# Patient Record
Sex: Female | Born: 1950 | Race: Black or African American | Hispanic: No | Marital: Married | Smoking: Former smoker
Health system: Southern US, Community
[De-identification: ages and names within clinical notes are randomized; demographics above are authoritative.]

## PROBLEM LIST (undated history)

## (undated) DIAGNOSIS — I10 Essential (primary) hypertension: Secondary | ICD-10-CM

## (undated) DIAGNOSIS — Z21 Asymptomatic human immunodeficiency virus [HIV] infection status: Secondary | ICD-10-CM

## (undated) DIAGNOSIS — B20 Human immunodeficiency virus [HIV] disease: Secondary | ICD-10-CM

## (undated) DIAGNOSIS — I2510 Atherosclerotic heart disease of native coronary artery without angina pectoris: Secondary | ICD-10-CM

## (undated) DIAGNOSIS — M5430 Sciatica, unspecified side: Secondary | ICD-10-CM

## (undated) DIAGNOSIS — E119 Type 2 diabetes mellitus without complications: Secondary | ICD-10-CM

## (undated) DIAGNOSIS — D759 Disease of blood and blood-forming organs, unspecified: Secondary | ICD-10-CM

## (undated) HISTORY — DX: Sciatica, unspecified side: M54.30

## (undated) HISTORY — PX: CARDIAC CATHETERIZATION: SHX172

## (undated) HISTORY — PX: CORONARY ARTERY BYPASS GRAFT: SHX141

---

## 2012-04-16 ENCOUNTER — Ambulatory Visit (INDEPENDENT_AMBULATORY_CARE_PROVIDER_SITE_OTHER): Payer: Medicaid Other

## 2012-04-16 ENCOUNTER — Ambulatory Visit: Payer: Self-pay

## 2012-04-16 DIAGNOSIS — Z21 Asymptomatic human immunodeficiency virus [HIV] infection status: Secondary | ICD-10-CM

## 2012-04-16 DIAGNOSIS — B20 Human immunodeficiency virus [HIV] disease: Secondary | ICD-10-CM

## 2012-04-16 DIAGNOSIS — E7800 Pure hypercholesterolemia, unspecified: Secondary | ICD-10-CM

## 2012-04-16 DIAGNOSIS — I10 Essential (primary) hypertension: Secondary | ICD-10-CM

## 2012-04-16 DIAGNOSIS — Z23 Encounter for immunization: Secondary | ICD-10-CM

## 2012-04-16 DIAGNOSIS — J45909 Unspecified asthma, uncomplicated: Secondary | ICD-10-CM

## 2012-04-16 DIAGNOSIS — Z951 Presence of aortocoronary bypass graft: Secondary | ICD-10-CM

## 2012-04-16 DIAGNOSIS — E119 Type 2 diabetes mellitus without complications: Secondary | ICD-10-CM

## 2012-04-16 DIAGNOSIS — M109 Gout, unspecified: Secondary | ICD-10-CM

## 2012-04-16 DIAGNOSIS — I2510 Atherosclerotic heart disease of native coronary artery without angina pectoris: Secondary | ICD-10-CM

## 2012-04-16 LAB — CBC WITH DIFFERENTIAL/PLATELET
Basophils Absolute: 0 10*3/uL (ref 0.0–0.1)
Basophils Relative: 0 % (ref 0–1)
Eosinophils Relative: 2 % (ref 0–5)
HCT: 37 % (ref 36.0–46.0)
Lymphocytes Relative: 37 % (ref 12–46)
MCHC: 32.7 g/dL (ref 30.0–36.0)
MCV: 83 fL (ref 78.0–100.0)
Monocytes Absolute: 0.4 10*3/uL (ref 0.1–1.0)
Neutro Abs: 2.8 10*3/uL (ref 1.7–7.7)
Platelets: 300 10*3/uL (ref 150–400)
RDW: 15.8 % — ABNORMAL HIGH (ref 11.5–15.5)
WBC: 5.3 10*3/uL (ref 4.0–10.5)

## 2012-04-17 LAB — T-HELPER CELL (CD4) - (RCID CLINIC ONLY): CD4 T Cell Abs: 670 uL (ref 400–2700)

## 2012-04-17 LAB — LIPID PANEL: LDL Cholesterol: 125 mg/dL — ABNORMAL HIGH (ref 0–99)

## 2012-04-17 LAB — HEPATITIS C ANTIBODY: HCV Ab: NEGATIVE

## 2012-04-17 LAB — COMPLETE METABOLIC PANEL WITH GFR
ALT: 14 U/L (ref 0–35)
AST: 16 U/L (ref 0–37)
Alkaline Phosphatase: 80 U/L (ref 39–117)
BUN: 19 mg/dL (ref 6–23)
Calcium: 9.4 mg/dL (ref 8.4–10.5)
Chloride: 106 mEq/L (ref 96–112)
Creat: 0.89 mg/dL (ref 0.50–1.10)

## 2012-04-17 LAB — HEPATITIS B CORE ANTIBODY, TOTAL: Hep B Core Total Ab: NEGATIVE

## 2012-04-17 LAB — HIV-1 RNA ULTRAQUANT REFLEX TO GENTYP+
HIV 1 RNA Quant: <20 copies/mL (ref <20)
HIV-1 RNA Quant, Log: <1.3 {Log} (ref <1.30)

## 2012-04-17 LAB — HEPATITIS B SURFACE ANTIGEN: Hepatitis B Surface Ag: NEGATIVE

## 2012-04-17 LAB — RPR

## 2012-04-17 LAB — HEPATITIS B SURFACE ANTIBODY,QUALITATIVE: Hep B S Ab: NONREACTIVE

## 2012-04-17 LAB — HEPATITIS A ANTIBODY, TOTAL: Hep A Total Ab: POSITIVE — AB

## 2012-04-18 LAB — TB SKIN TEST
Induration: 0 mm
TB Skin Test: NEGATIVE

## 2012-04-27 DIAGNOSIS — J45909 Unspecified asthma, uncomplicated: Secondary | ICD-10-CM | POA: Insufficient documentation

## 2012-04-27 DIAGNOSIS — Z951 Presence of aortocoronary bypass graft: Secondary | ICD-10-CM | POA: Insufficient documentation

## 2012-04-27 DIAGNOSIS — I2510 Atherosclerotic heart disease of native coronary artery without angina pectoris: Secondary | ICD-10-CM | POA: Insufficient documentation

## 2012-04-27 DIAGNOSIS — I10 Essential (primary) hypertension: Secondary | ICD-10-CM | POA: Insufficient documentation

## 2012-04-27 DIAGNOSIS — B20 Human immunodeficiency virus [HIV] disease: Secondary | ICD-10-CM | POA: Insufficient documentation

## 2012-04-27 DIAGNOSIS — E7800 Pure hypercholesterolemia, unspecified: Secondary | ICD-10-CM | POA: Insufficient documentation

## 2012-04-27 DIAGNOSIS — E119 Type 2 diabetes mellitus without complications: Secondary | ICD-10-CM | POA: Insufficient documentation

## 2012-04-27 DIAGNOSIS — M109 Gout, unspecified: Secondary | ICD-10-CM | POA: Insufficient documentation

## 2012-05-08 LAB — HIV-1 RNA QUANT-NO REFLEX-BLD: HIV 1 RNA Quant: <20

## 2012-05-08 NOTE — Progress Notes (Signed)
Pt and family moved from [place] to [place].  Pt and spouse have  been positive since 2003.   Laurell Josephs, RN

## 2012-05-13 ENCOUNTER — Other Ambulatory Visit (HOSPITAL_COMMUNITY)
Admission: RE | Admit: 2012-05-13 | Discharge: 2012-05-13 | Disposition: A | Discharge status: Home / Self Care | Payer: Medicaid Other | Source: Ambulatory Visit | Attending: Internal Medicine | Admitting: Internal Medicine

## 2012-05-13 ENCOUNTER — Ambulatory Visit (INDEPENDENT_AMBULATORY_CARE_PROVIDER_SITE_OTHER): Payer: Medicaid Other | Admitting: Internal Medicine

## 2012-05-13 ENCOUNTER — Encounter: Payer: Self-pay | Admitting: Internal Medicine

## 2012-05-13 VITALS — BP 143/100 | HR 79 | Temp 97.7°F | Wt 226.0 lb

## 2012-05-13 DIAGNOSIS — Z113 Encounter for screening for infections with a predominantly sexual mode of transmission: Secondary | ICD-10-CM | POA: Insufficient documentation

## 2012-05-13 DIAGNOSIS — Z0000 Encounter for general adult medical examination without abnormal findings: Secondary | ICD-10-CM

## 2012-05-13 NOTE — Addendum Note (Signed)
Addended by: Mariea Clonts D on: 05/13/2012 01:05 PM   Modules accepted: Orders

## 2012-05-13 NOTE — Progress Notes (Signed)
[facility] NOTE  RFV: establishing care, new to the clinic Subjective:    Patient ID: Katie Mccarthy, female    DOB: [birth year], 62 y.o.   MRN: [id number]  HPI 61yo F iwht HIV,CAD s/p CABG 2012, HLD,DM. CD 4 count of 670/ VL < 20,on atripla (in Dec). Dx in 2003, but didn't start cART in 2007 in [place].   Moved to [place] in June 2013. [place] just started with PCP and getting referred to cardiology. (PCP sara Geraldo Pitter, [facility]) doing well overall. No compliants  Current Outpatient Prescriptions on File Prior to Visit  Medication Sig Dispense Refill  . amLODipine (NORVASC) 10 MG tablet Take 10 mg by mouth daily.      . cloNIDine (CATAPRES) 0.1 MG tablet Take 0.1 mg by mouth 2 (two) times daily.      Marland Kitchen efavirenz-emtricitabine-tenofovir (ATRIPLA) 600-200-300 MG per tablet Take 1 tablet by mouth at bedtime.      . furosemide (LASIX) 20 MG tablet Take 20 mg by mouth 2 (two) times daily.      Marland Kitchen gabapentin (NEURONTIN) 100 MG capsule Take 100 mg by mouth 2 (two) times daily.      . isosorbide dinitrate (ISORDIL) 30 MG tablet Take 30 mg by mouth 4 (four) times daily.      Marland Kitchen lisinopril (PRINIVIL,ZESTRIL) 20 MG tablet Take 20 mg by mouth 2 (two) times daily.      . metFORMIN (GLUCOPHAGE) 500 MG tablet Take 500 mg by mouth daily with breakfast.      . metoprolol succinate (TOPROL-XL) 50 MG 24 hr tablet Take 50 mg by mouth 2 (two) times daily. Take with or immediately following a meal.      . pravastatin (PRAVACHOL) 40 MG tablet Take 40 mg by mouth daily.       Active Ambulatory Problems    Diagnosis Date Noted  . HIV (human immunodeficiency virus infection) 04/27/2012  . Gout 04/27/2012  . Hypertension 04/27/2012  . Diabetes 04/27/2012  . Asthma 04/27/2012  . Hypercholesteremia 04/27/2012  . CAD (coronary artery disease) 04/27/2012  . Hx of CABG 04/27/2012   Resolved Ambulatory Problems    Diagnosis Date Noted  . No Resolved Ambulatory Problems   No  Additional Past Medical History   History  Substance Use Topics  . Smoking status: Former Smoker    Types: Cigarettes    Quit date: 11/23/2010  . Smokeless tobacco: Never Used  . Alcohol Use: No  family history includes Coronary artery disease in her father; Diabetes in her brother, father, mother, and sister; and Heart disease in her mother.    Review of Systems Review of Systems  Constitutional: Negative for fever, chills, diaphoresis, activity change, appetite change, fatigue and unexpected weight change.  HENT: Negative for congestion, sore throat, rhinorrhea, sneezing, trouble swallowing and sinus pressure.  Eyes: Negative for photophobia and visual disturbance.  Respiratory: Negative for cough, chest tightness, shortness of breath, wheezing and stridor.  Cardiovascular: Negative for chest pain, palpitations and leg swelling.  Gastrointestinal: Negative for nausea, vomiting, abdominal pain, diarrhea, constipation, blood in stool, abdominal distention and anal bleeding.  Genitourinary: Negative for dysuria, hematuria, flank pain and difficulty urinating.  Musculoskeletal: Negative for myalgias, back pain, joint swelling, arthralgias and gait problem.  Skin: Negative for color change, pallor, rash and wound.  Neurological: Negative for dizziness, tremors, weakness and light-headedness.  Hematological: Negative for adenopathy. Does not bruise/bleed easily.  Psychiatric/Behavioral: Negative for behavioral problems, confusion, sleep disturbance, dysphoric  mood, decreased concentration and agitation.       Objective:   Physical Exam  BP 143/100  Pulse 79  Temp 97.7 F (36.5 C) (Oral)  Wt 226 lb (102.513 kg) Physical Exam  Constitutional: oriented to person, place, and time.  appears well-developed and well-nourished. No distress.  HENT:  Mouth/Throat: Oropharynx is clear and moist. No oropharyngeal exudate.  Cardiovascular: Normal rate, regular rhythm and normal heart sounds.  Exam reveals no gallop and no friction rub.  No murmur heard.  Pulmonary/Chest: Effort normal and breath sounds normal. No respiratory distress. He has no wheezes.  Abdominal: Soft. Bowel sounds are normal.  exhibits no distension. There is no tenderness.  Lymphadenopathy:  no cervical adenopathy.  Neurological:  alert and oriented to person, place, and time.  Skin: Skin is warm and dry. No rash noted. No erythema.       Assessment & Plan:  HIV = continue with current regimen; refill meds. Covered through medicaid  CAD= continue on current regimen, she is to get established with cardiologist  HTN= mildly elevated today. Will defer to pcp and cardiology to adjust meds. 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

## 2012-05-19 ENCOUNTER — Other Ambulatory Visit: Payer: Self-pay | Admitting: Physician Assistant

## 2012-05-19 DIAGNOSIS — Z1231 Encounter for screening mammogram for malignant neoplasm of breast: Secondary | ICD-10-CM

## 2012-06-19 ENCOUNTER — Ambulatory Visit: Payer: Medicaid Other

## 2012-07-06 ENCOUNTER — Ambulatory Visit: Payer: Medicaid Other

## 2012-08-03 ENCOUNTER — Ambulatory Visit: Payer: Medicaid Other

## 2012-08-11 ENCOUNTER — Other Ambulatory Visit: Payer: Medicaid Other

## 2012-08-11 ENCOUNTER — Other Ambulatory Visit: Payer: Self-pay | Admitting: Internal Medicine

## 2012-08-11 DIAGNOSIS — B20 Human immunodeficiency virus [HIV] disease: Secondary | ICD-10-CM

## 2012-08-11 LAB — CBC WITH DIFFERENTIAL/PLATELET
Basophils Absolute: 0 10*3/uL (ref 0.0–0.1)
Lymphocytes Relative: 36 % (ref 12–46)
Lymphs Abs: 1.6 10*3/uL (ref 0.7–4.0)
MCV: 84.6 fL (ref 78.0–100.0)
Neutro Abs: 2.5 10*3/uL (ref 1.7–7.7)
Platelets: 285 10*3/uL (ref 150–400)
RBC: 4.49 MIL/uL (ref 3.87–5.11)
RDW: 15.6 % — ABNORMAL HIGH (ref 11.5–15.5)
WBC: 4.5 10*3/uL (ref 4.0–10.5)

## 2012-08-11 LAB — COMPREHENSIVE METABOLIC PANEL
AST: 15 U/L (ref 0–37)
Albumin: 3.9 g/dL (ref 3.5–5.2)
Alkaline Phosphatase: 78 U/L (ref 39–117)
BUN: 20 mg/dL (ref 6–23)
Calcium: 9.3 mg/dL (ref 8.4–10.5)
Creat: 0.95 mg/dL (ref 0.50–1.10)
Glucose, Bld: 98 mg/dL (ref 70–99)

## 2012-08-12 LAB — T-HELPER CELL (CD4) - (RCID CLINIC ONLY): CD4 % Helper T Cell: 36 % (ref 33–55)

## 2012-08-12 LAB — HIV-1 RNA QUANT-NO REFLEX-BLD: HIV 1 RNA Quant: <20 copies/mL (ref <20)

## 2012-08-20 ENCOUNTER — Emergency Department (HOSPITAL_COMMUNITY)
Admission: EM | Admit: 2012-08-20 | Discharge: 2012-08-21 | Disposition: A | Discharge status: Home / Self Care | Payer: Medicaid Other | Attending: Emergency Medicine | Admitting: Emergency Medicine

## 2012-08-20 ENCOUNTER — Emergency Department (HOSPITAL_COMMUNITY): Payer: Medicaid Other

## 2012-08-20 ENCOUNTER — Encounter (HOSPITAL_COMMUNITY): Payer: Self-pay | Admitting: *Deleted

## 2012-08-20 DIAGNOSIS — E119 Type 2 diabetes mellitus without complications: Secondary | ICD-10-CM | POA: Insufficient documentation

## 2012-08-20 DIAGNOSIS — E669 Obesity, unspecified: Secondary | ICD-10-CM | POA: Insufficient documentation

## 2012-08-20 DIAGNOSIS — I10 Essential (primary) hypertension: Secondary | ICD-10-CM | POA: Insufficient documentation

## 2012-08-20 DIAGNOSIS — R079 Chest pain, unspecified: Secondary | ICD-10-CM

## 2012-08-20 DIAGNOSIS — I2510 Atherosclerotic heart disease of native coronary artery without angina pectoris: Secondary | ICD-10-CM | POA: Insufficient documentation

## 2012-08-20 DIAGNOSIS — R0602 Shortness of breath: Secondary | ICD-10-CM | POA: Insufficient documentation

## 2012-08-20 DIAGNOSIS — Z79899 Other long term (current) drug therapy: Secondary | ICD-10-CM | POA: Insufficient documentation

## 2012-08-20 DIAGNOSIS — Z21 Asymptomatic human immunodeficiency virus [HIV] infection status: Secondary | ICD-10-CM | POA: Insufficient documentation

## 2012-08-20 DIAGNOSIS — Z7982 Long term (current) use of aspirin: Secondary | ICD-10-CM | POA: Insufficient documentation

## 2012-08-20 DIAGNOSIS — Z87891 Personal history of nicotine dependence: Secondary | ICD-10-CM | POA: Insufficient documentation

## 2012-08-20 DIAGNOSIS — Z8679 Personal history of other diseases of the circulatory system: Secondary | ICD-10-CM | POA: Insufficient documentation

## 2012-08-20 HISTORY — DX: Type 2 diabetes mellitus without complications: E11.9

## 2012-08-20 HISTORY — DX: Human immunodeficiency virus (HIV) disease: B20

## 2012-08-20 HISTORY — DX: Asymptomatic human immunodeficiency virus (hiv) infection status: Z21

## 2012-08-20 HISTORY — DX: Essential (primary) hypertension: I10

## 2012-08-20 HISTORY — DX: Atherosclerotic heart disease of native coronary artery without angina pectoris: I25.10

## 2012-08-20 LAB — CBC WITH DIFFERENTIAL/PLATELET
Basophils Absolute: 0 10*3/uL (ref 0.0–0.1)
Eosinophils Relative: 1 % (ref 0–5)
HCT: 35.3 % — ABNORMAL LOW (ref 36.0–46.0)
Lymphocytes Relative: 36 % (ref 12–46)
Lymphs Abs: 2.6 10*3/uL (ref 0.7–4.0)
MCV: 81 fL (ref 78.0–100.0)
Neutro Abs: 3.9 10*3/uL (ref 1.7–7.7)
Platelets: 271 10*3/uL (ref 150–400)
RBC: 4.36 MIL/uL (ref 3.87–5.11)
RDW: 14 % (ref 11.5–15.5)
WBC: 7.2 10*3/uL (ref 4.0–10.5)

## 2012-08-20 LAB — BASIC METABOLIC PANEL
CO2: 25 mEq/L (ref 19–32)
Chloride: 105 mEq/L (ref 96–112)
Glucose, Bld: 121 mg/dL — ABNORMAL HIGH (ref 70–99)
Sodium: 140 mEq/L (ref 135–145)

## 2012-08-20 LAB — POCT I-STAT TROPONIN I: Troponin i, poc: 0 ng/mL (ref 0.00–0.08)

## 2012-08-20 MED ORDER — IOHEXOL 350 MG/ML SOLN
100.0000 mL | Freq: Once | INTRAVENOUS | Status: AC | PRN
  Administered 2012-08-20: 100 mL via INTRAVENOUS

## 2012-08-20 NOTE — ED Notes (Signed)
Pt reports chest pain for three days that seem to be worse with deep breathing.  One SL Nitro given by EMS, that resolved pt's chest pain.

## 2012-08-20 NOTE — ED Notes (Signed)
ED PA in room with pt 

## 2012-08-20 NOTE — ED Provider Notes (Signed)
History     CSN: [id number]  Arrival date & time 08/20/12  4098   First MD Initiated Contact with Patient 08/20/12 2007      Chief Complaint  Patient presents with  . Chest Pain    (Consider location/radiation/quality/duration/timing/severity/associated sxs/prior treatment) HPI Katie Mccarthy is a 62 y.o. female who presents to ED with complaint of chest pain. States has had 3 stents placed in the past in [facility] for CAD, currently followed by a cardiology in [place]. States this pain started 3 days ago. Sharp. Worse with breathing. States also having some exertional chest tightness and shortness of breath. Pain is constant for last 3 days, sharp, worse with deep breathing. States no recent heart problems. No fever, chills, cough. No abdominal pain. Given a nitro per EMS with improvement in chest pain. States nothing making it better or worse at present. No other complaints. Pt is HIV positive.   Past Medical History  Diagnosis Date  . Coronary artery disease   . Hypertension   . Diabetes mellitus without complication   . HIV infection     Past Surgical History  Procedure Laterality Date  . Cardiac catheterization      Family History  Problem Relation Age of Onset  . Diabetes Mother   . Heart disease Mother   . Diabetes Father   . Coronary artery disease Father   . Diabetes Sister   . Diabetes Brother     History  Substance Use Topics  . Smoking status: Former Smoker    Types: Cigarettes    Quit date: 11/23/2010  . Smokeless tobacco: Never Used  . Alcohol Use: No    OB History   Grav Para Term Preterm Abortions TAB SAB Ect Mult Living                  Review of Systems  Constitutional: Negative for fever and chills.  HENT: Negative for neck pain and neck stiffness.   Respiratory: Positive for chest tightness and shortness of breath.   Cardiovascular: Positive for chest pain. Negative for palpitations and leg swelling.  Gastrointestinal: Negative.    Genitourinary: Negative for dysuria and flank pain.  Musculoskeletal: Negative.   Neurological: Negative for dizziness, weakness and headaches.    Allergies  Review of patient's allergies indicates no known allergies.  Home Medications   Current Outpatient Rx  Name  Route  Sig  Dispense  Refill  . amLODipine (NORVASC) 10 MG tablet   Oral   Take 10 mg by mouth daily.         Marland Kitchen aspirin 81 MG chewable tablet   Oral   Chew 81 mg by mouth daily. Took 2 tablets in morning, 2 more around lunch time, then EMS told her to take 4 more tablets-only for today 08/20/12 (CP).         . cloNIDine (CATAPRES) 0.1 MG tablet   Oral   Take 0.1 mg by mouth 2 (two) times daily.         Marland Kitchen efavirenz-emtricitabine-tenofovir (ATRIPLA) 600-200-300 MG per tablet   Oral   Take 1 tablet by mouth at bedtime.         . furosemide (LASIX) 20 MG tablet   Oral   Take 20 mg by mouth daily.          Marland Kitchen gabapentin (NEURONTIN) 100 MG capsule   Oral   Take 300 mg by mouth every evening.          . isosorbide  mononitrate (IMDUR) 60 MG 24 hr tablet   Oral   Take 60 mg by mouth daily.         Marland Kitchen lisinopril (PRINIVIL,ZESTRIL) 20 MG tablet   Oral   Take 20 mg by mouth daily.          . metFORMIN (GLUCOPHAGE) 500 MG tablet   Oral   Take 500 mg by mouth daily with breakfast.         . metoprolol succinate (TOPROL-XL) 50 MG 24 hr tablet   Oral   Take 50 mg by mouth daily. Take with or immediately following a meal.           BP 123/87  Pulse 92  Temp(Src) 98.5 F (36.9 C) (Oral)  Resp 20  Ht 5\' 9"  (1.753 m)  Wt 214 lb (97.07 kg)  BMI 31.59 kg/m2  SpO2 98%  Physical Exam  Nursing note and vitals reviewed. Constitutional: She is oriented to person, place, and time. She appears well-developed and well-nourished. No distress.  HENT:  Head: Normocephalic and atraumatic.  Eyes: Conjunctivae are normal.  Neck: Neck supple.  Cardiovascular: Normal rate, regular rhythm and normal  heart sounds.   Pulmonary/Chest: Effort normal and breath sounds normal. No respiratory distress. She has no wheezes. She has no rales.  Abdominal: Soft. Bowel sounds are normal. She exhibits no distension. There is no tenderness. There is no rebound.  Musculoskeletal: She exhibits no edema.  Neurological: She is alert and oriented to person, place, and time.  Skin: Skin is warm and dry.  Psychiatric: She has a normal mood and affect.    ED Course  Procedures (including critical care time)  Results for orders placed during the hospital encounter of 08/20/12  CBC WITH DIFFERENTIAL      Result Value Range   WBC 7.2  4.0 - 10.5 K/uL   RBC 4.36  3.87 - 5.11 MIL/uL   Hemoglobin 12.0  12.0 - 15.0 g/dL   HCT 14.7 (*) 82.9 - 56.2 %   MCV 81.0  78.0 - 100.0 fL   MCH 27.5  26.0 - 34.0 pg   MCHC 34.0  30.0 - 36.0 g/dL   RDW 13.0  86.5 - 78.4 %   Platelets 271  150 - 400 K/uL   Neutrophils Relative 54  43 - 77 %   Neutro Abs 3.9  1.7 - 7.7 K/uL   Lymphocytes Relative 36  12 - 46 %   Lymphs Abs 2.6  0.7 - 4.0 K/uL   Monocytes Relative 9  3 - 12 %   Monocytes Absolute 0.7  0.1 - 1.0 K/uL   Eosinophils Relative 1  0 - 5 %   Eosinophils Absolute 0.1  0.0 - 0.7 K/uL   Basophils Relative 0  0 - 1 %   Basophils Absolute 0.0  0.0 - 0.1 K/uL  BASIC METABOLIC PANEL      Result Value Range   Sodium 140  135 - 145 mEq/L   Potassium 3.9  3.5 - 5.1 mEq/L   Chloride 105  96 - 112 mEq/L   CO2 25  19 - 32 mEq/L   Glucose, Bld 121 (*) 70 - 99 mg/dL   BUN 12  6 - 23 mg/dL   Creatinine, Ser 6.96  0.50 - 1.10 mg/dL   Calcium 9.5  8.4 - 29.5 mg/dL   GFR calc non Af Amer 65 (*) >90 mL/min   GFR calc Af Amer 76 (*) >90 mL/min  D-DIMER, QUANTITATIVE  Result Value Range   D-Dimer, Quant 0.49 (*) 0.00 - 0.48 ug/mL-FEU  POCT I-STAT TROPONIN I      Result Value Range   Troponin i, poc 0.00  0.00 - 0.08 ng/mL   Comment 3           POCT I-STAT TROPONIN I      Result Value Range   Troponin i, poc 0.01   0.00 - 0.08 ng/mL   Comment 3            Ct Angio Chest W/cm &/or Wo Cm  08/20/2012  *RADIOLOGY REPORT*  Clinical Data: Chest pain.  Painful respiration.  CT ANGIOGRAPHY CHEST  Technique:  Multidetector CT imaging of the chest using the standard protocol during bolus administration of intravenous contrast. Multiplanar reconstructed images including MIPs were obtained and reviewed to evaluate the vascular anatomy.  Contrast: OMNIPAQUE IOHEXOL 350 MG/ML SOLN  Comparison: Chest x-ray dated 08/20/2012  Findings: There are no pulmonary emboli, infiltrates, or effusions. Slight cardiomegaly.  Slight atelectasis at the lung bases.  No acute osseous abnormality.  Prior CABG.  Coronary artery stent in place.  IMPRESSION:  1.  No pulmonary emboli. 2.  Slight bibasilar atelectasis. 3.  Slight cardiomegaly.   Original Report Authenticated By: Francene Boyers, M.D.    Dg Chest Portable 1 View  08/20/2012  *RADIOLOGY REPORT*  Clinical Data: Chest pain  PORTABLE CHEST - 1 VIEW  Comparison: None.  Findings: There is bibasilar linear atelectasis present.  Early pneumonia particularly at the left lung base cannot be excluded and follow-up chest x-ray is recommended if warranted clinically.  The heart is mildly enlarged.  Median sternotomy sutures are noted.  No bony abnormality is seen.  IMPRESSION: Bibasilar opacities most consistent with linear atelectasis. Pneumonia particularly at the left lung base cannot be excluded. Consider follow-up.   Original Report Authenticated By: Dwyane Dee, M.D.       1. Chest pain       MDM  Pt with atypical chest pain. She does have hx of CAD with stenting. Followed by Dr. Leeann Must cardiology in [place]. Her pain today is atypical for CAD. Her ECG unremarkable. Her Troponin x 2 was obtained and negative, given pain for last 3 days constat, I think appropriate for rule out. Her d dimer slightly elevated, CT angio was obtained and negative. No signs of pneumonia on CT as  well as was suspected on CXR. Discussed with Dr. Ignacia Palma, who has seen pt. Will d/c home with close follow up with Dr. Leeann Must. Pt agrees with the plan.   Filed Vitals:   08/20/12 1943 08/20/12 1946 08/20/12 1953 08/20/12 2313  BP:   123/87 138/82  Pulse: 92     Temp:   98.5 F (36.9 C)   TempSrc:   Oral   Resp:   20 11  Height:  5\' 9"  (1.753 m)    Weight:  214 lb (97.07 kg)    SpO2: 98%  98% 99%           Myriam Jacobson Chestine Belknap, PA-C 08/21/12 0016

## 2012-08-20 NOTE — ED Provider Notes (Signed)
8:08 PM  Date: 08/20/2012  Rate:87  Rhythm: normal sinus rhythm  QRS Axis: left  Intervals: normal QRS:  Left atrial abnormality.  ST/T Wave abnormalities: Inverted T waves in I, AVL  Conduction Disutrbances:none  Narrative Interpretation: Abnormal EKG  Old EKG Reviewed: none available    Carleene Cooper III, MD 08/20/12 2013

## 2012-08-21 NOTE — Progress Notes (Signed)
P is a 62 yo woman who had cardiac stents 2 years ago in [place], who has had chest pain and cough for 3 days.  Exam shows her to be an obese woman in no distress.  Lungs clear, heart sound normal.  EKG nonacute, cardiac markers x 2 negative, CT angio of chest negative.  Reassured and released, with advice to have office followup with Dr. Leeann Must, her cardiologist in [place], [place].

## 2012-08-21 NOTE — ED Provider Notes (Signed)
Medical screening examination/treatment/procedure(s) were conducted as a shared visit with non-physician practitioner(s) and myself.  I personally evaluated the patient during the encounter P is a 62 yo woman who had cardiac stents 2 years ago in [place], who has had chest pain and cough for 3 days. Exam shows her to be an obese woman in no distress. Lungs clear, heart sound normal. EKG nonacute, cardiac markers x 2 negative, CT angio of chest negative. Reassured and released, with advice to have office followup with Dr. Leeann Must, her cardiologist in [place], [place].        Carleene Cooper III, MD 08/21/12 [phone]

## 2012-08-25 ENCOUNTER — Ambulatory Visit: Payer: Medicaid Other | Admitting: Internal Medicine

## 2012-08-31 ENCOUNTER — Ambulatory Visit
Admission: RE | Admit: 2012-08-31 | Discharge: 2012-08-31 | Disposition: A | Discharge status: Home / Self Care | Payer: Medicaid Other | Source: Ambulatory Visit | Attending: Physician Assistant | Admitting: Physician Assistant

## 2012-08-31 DIAGNOSIS — Z1231 Encounter for screening mammogram for malignant neoplasm of breast: Secondary | ICD-10-CM

## 2012-09-14 ENCOUNTER — Other Ambulatory Visit: Payer: Self-pay | Admitting: Physician Assistant

## 2012-09-14 DIAGNOSIS — R928 Other abnormal and inconclusive findings on diagnostic imaging of breast: Secondary | ICD-10-CM

## 2012-09-23 ENCOUNTER — Ambulatory Visit: Payer: Medicaid Other | Admitting: Internal Medicine

## 2012-09-30 ENCOUNTER — Encounter: Payer: Self-pay | Admitting: Internal Medicine

## 2012-09-30 ENCOUNTER — Ambulatory Visit (INDEPENDENT_AMBULATORY_CARE_PROVIDER_SITE_OTHER): Payer: Medicaid Other | Admitting: Internal Medicine

## 2012-09-30 VITALS — BP 164/103 | HR 78 | Temp 97.6°F | Wt 221.0 lb

## 2012-09-30 DIAGNOSIS — M109 Gout, unspecified: Secondary | ICD-10-CM

## 2012-09-30 MED ORDER — PREDNISONE 20 MG PO TABS: 40.0000 mg | ORAL_TABLET | Freq: Every day | ORAL | Status: DC

## 2012-09-30 MED ORDER — OXYCODONE-ACETAMINOPHEN 5-325 MG PO TABS: 1.0000 | ORAL_TABLET | Freq: Four times a day (QID) | ORAL | Status: DC | PRN

## 2012-09-30 NOTE — Progress Notes (Signed)
[facility] NOTE  RFV: routine/also gout  Subjective:    Patient ID: Katie Mccarthy, female    DOB: Aug 26, 1950, 62 y.o.   MRN: [id number]  HPI Katie Mccarthy, 63 yo F with HIV, CD 4 count of 590/VL<20, on atripla doing welll. Also has CAD s/p CABG 2012, HLD,DM. Noticed having gout attack in the past 2 wks the right foot great toe. Not taking meds for gout.Not on allopurinol nor colchicine. She has been taking 400mg  ibuprofen BID.Hasn't been checking blood sugars bid. Reports not missing a dose of atripla despite new health problem.  No fever, chills, nightsweats. Only pain to right toe   Current Outpatient Prescriptions on File Prior to Visit  Medication Sig Dispense Refill  . amLODipine (NORVASC) 10 MG tablet Take 10 mg by mouth daily.      Marland Kitchen aspirin 81 MG chewable tablet Chew 81 mg by mouth daily. Took 2 tablets in morning, 2 more around lunch time, then EMS told her to take 4 more tablets-only for today 08/20/12 (CP).      . cloNIDine (CATAPRES) 0.1 MG tablet Take 0.1 mg by mouth 2 (two) times daily.      Marland Kitchen efavirenz-emtricitabine-tenofovir (ATRIPLA) 600-200-300 MG per tablet Take 1 tablet by mouth at bedtime.      . furosemide (LASIX) 20 MG tablet Take 20 mg by mouth daily.       Marland Kitchen gabapentin (NEURONTIN) 100 MG capsule Take 300 mg by mouth every evening.       . isosorbide mononitrate (IMDUR) 60 MG 24 hr tablet Take 60 mg by mouth daily.      Marland Kitchen lisinopril (PRINIVIL,ZESTRIL) 20 MG tablet Take 20 mg by mouth daily.       . metFORMIN (GLUCOPHAGE) 500 MG tablet Take 500 mg by mouth daily with breakfast.      . metoprolol succinate (TOPROL-XL) 50 MG 24 hr tablet Take 50 mg by mouth daily. Take with or immediately following a meal.       No current facility-administered medications on file prior to visit.   Social and family hx unchanged    Review of Systems  Constitutional: Negative for fever, chills, diaphoresis, activity change, appetite change, fatigue and unexpected weight change.   HENT: Negative for congestion, sore throat, rhinorrhea, sneezing, trouble swallowing and sinus pressure.  Eyes: Negative for photophobia and visual disturbance.  Respiratory: Negative for cough, chest tightness, shortness of breath, wheezing and stridor.  Cardiovascular: Negative for chest pain, palpitations and leg swelling.  Gastrointestinal: Negative for nausea, vomiting, abdominal pain, diarrhea, constipation, blood in stool, abdominal distention and anal bleeding.  Genitourinary: Negative for dysuria, hematuria, flank pain and difficulty urinating.  Musculoskeletal: right toe pain  Skin: Negative for color change, pallor, rash and wound.  Neurological: Negative for dizziness, tremors, weakness and light-headedness.  Hematological: Negative for adenopathy. Does not bruise/bleed easily.  Psychiatric/Behavioral: Negative for behavioral problems, confusion, sleep disturbance, dysphoric mood, decreased concentration and agitation.       Objective:   Physical Exam BP 164/103  Pulse 78  Temp(Src) 97.6 F (36.4 C) (Oral)  Wt 221 lb (100.245 kg)  BMI 32.62 kg/m2 Physical Exam  Constitutional:  oriented to person, place, and time. appears well-developed and well-nourished. No distress.  HENT:  Mouth/Throat: Oropharynx is clear and moist. No oropharyngeal exudate.  Cardiovascular: Normal rate, regular rhythm and normal heart sounds. Exam reveals no gallop and no friction rub.  No murmur heard.  Pulmonary/Chest: Effort normal and breath sounds normal. No respiratory distress.  He has no wheezes.  Abdominal: Soft. Bowel sounds are normal. He exhibits no distension. There is no tenderness.  Lymphadenopathy:  no cervical adenopathy.  Neurological:  alert and oriented to person, place, and time. Tender 1st mp joint on right foot  Skin: Skin is warm and dry. No rash noted. No erythema.  Psychiatric:  a normal mood and affect.  behavior is normal.       Assessment & Plan:  HIV = not missed  any dose. Having great suppression. Continue with atripla.  Gouty flare = will give steroid taper, prednisone 40mg  x 5 days, then 20mg  for 5 days. Last attack was 18 months. Percocet for pain meds #15.  Women's health = needs repeat mammo which is scheduled for this month  rtc in 3 months

## 2012-10-06 ENCOUNTER — Ambulatory Visit
Admission: RE | Admit: 2012-10-06 | Discharge: 2012-10-06 | Disposition: A | Discharge status: Home / Self Care | Payer: Medicaid Other | Source: Ambulatory Visit | Attending: Physician Assistant | Admitting: Physician Assistant

## 2012-10-06 DIAGNOSIS — R928 Other abnormal and inconclusive findings on diagnostic imaging of breast: Secondary | ICD-10-CM

## 2012-11-16 ENCOUNTER — Encounter: Payer: Self-pay | Admitting: *Deleted

## 2012-12-22 ENCOUNTER — Other Ambulatory Visit: Payer: Medicaid Other

## 2012-12-28 ENCOUNTER — Other Ambulatory Visit (HOSPITAL_COMMUNITY)
Admission: RE | Admit: 2012-12-28 | Discharge: 2012-12-28 | Disposition: A | Discharge status: Home / Self Care | Payer: Medicaid Other | Source: Ambulatory Visit | Attending: Internal Medicine | Admitting: Internal Medicine

## 2012-12-28 ENCOUNTER — Ambulatory Visit (INDEPENDENT_AMBULATORY_CARE_PROVIDER_SITE_OTHER): Payer: Medicaid Other | Admitting: *Deleted

## 2012-12-28 ENCOUNTER — Other Ambulatory Visit: Payer: Medicaid Other

## 2012-12-28 DIAGNOSIS — IMO0002 Reserved for concepts with insufficient information to code with codable children: Secondary | ICD-10-CM

## 2012-12-28 DIAGNOSIS — Z23 Encounter for immunization: Secondary | ICD-10-CM

## 2012-12-28 DIAGNOSIS — B20 Human immunodeficiency virus [HIV] disease: Secondary | ICD-10-CM

## 2012-12-28 DIAGNOSIS — R6889 Other general symptoms and signs: Secondary | ICD-10-CM

## 2012-12-28 DIAGNOSIS — Z01419 Encounter for gynecological examination (general) (routine) without abnormal findings: Secondary | ICD-10-CM | POA: Insufficient documentation

## 2012-12-28 DIAGNOSIS — Z124 Encounter for screening for malignant neoplasm of cervix: Secondary | ICD-10-CM

## 2012-12-28 LAB — CBC WITH DIFFERENTIAL/PLATELET
HCT: 38.7 % (ref 36.0–46.0)
Lymphocytes Relative: 47 % — ABNORMAL HIGH (ref 12–46)
Lymphs Abs: 2.3 10*3/uL (ref 0.7–4.0)
MCH: 27.4 pg (ref 26.0–34.0)
MCV: 83.6 fL (ref 78.0–100.0)
Monocytes Relative: 7 % (ref 3–12)
Neutrophils Relative %: 43 % (ref 43–77)
Platelets: 325 10*3/uL (ref 150–400)

## 2012-12-28 LAB — COMPREHENSIVE METABOLIC PANEL
ALT: 13 U/L (ref 0–35)
Albumin: 4.2 g/dL (ref 3.5–5.2)
CO2: 26 mEq/L (ref 19–32)
Calcium: 9.6 mg/dL (ref 8.4–10.5)
Chloride: 106 mEq/L (ref 96–112)
Glucose, Bld: 98 mg/dL (ref 70–99)
Potassium: 4.7 mEq/L (ref 3.5–5.3)
Sodium: 140 mEq/L (ref 135–145)
Total Protein: 7.2 g/dL (ref 6.0–8.3)

## 2012-12-28 NOTE — Patient Instructions (Signed)
Your results will be available on MyChart next week.  I will send you a reminder to look at them.  Thank you for coming to the Center for your care.  Angelique Blonder, RN

## 2012-12-28 NOTE — Progress Notes (Addendum)
  Subjective:     Katie Mccarthy is a 62 y.o. woman who comes in today for a  pap smear only.  Her most recent Pap smear was on 2004 . Previous abnormal Pap smears: no. Contraception: condoms.  Objective:    There were no vitals taken for this visit. Pelvic Exam:  Pap smear obtained.   Assessment:   Screening pap smear.   Plan:    Follow up in one year, or as indicated by Pap results.  Pt given educational materials re: HIV and diet, nutrition, exercise, BSE, health promotion, self-esteem, partner protection and PAP smears. Given condoms.   LSIL PAP smear results.  Referral to [facility] for f/u.

## 2012-12-29 LAB — HIV-1 RNA QUANT-NO REFLEX-BLD
HIV 1 RNA Quant: <20 copies/mL (ref <20)
HIV-1 RNA Quant, Log: <1.3 {Log} (ref <1.30)

## 2012-12-30 ENCOUNTER — Encounter: Payer: Self-pay | Admitting: *Deleted

## 2012-12-30 DIAGNOSIS — IMO0002 Reserved for concepts with insufficient information to code with codable children: Secondary | ICD-10-CM | POA: Insufficient documentation

## 2012-12-30 NOTE — Addendum Note (Signed)
Addended by: Jennet Maduro D on: 12/30/2012 12:54 PM   Modules accepted: Orders

## 2013-01-05 ENCOUNTER — Ambulatory Visit: Payer: Medicaid Other | Admitting: Internal Medicine

## 2013-01-06 ENCOUNTER — Encounter: Payer: Self-pay | Admitting: *Deleted

## 2013-01-07 ENCOUNTER — Ambulatory Visit: Payer: Medicaid Other | Admitting: Internal Medicine

## 2013-01-14 ENCOUNTER — Ambulatory Visit: Payer: Medicaid Other | Admitting: Internal Medicine

## 2013-01-17 ENCOUNTER — Encounter (HOSPITAL_COMMUNITY): Payer: Self-pay | Admitting: Emergency Medicine

## 2013-01-17 ENCOUNTER — Emergency Department (HOSPITAL_COMMUNITY)
Admission: EM | Admit: 2013-01-17 | Discharge: 2013-01-18 | Disposition: A | Discharge status: Home / Self Care | Payer: Medicaid Other | Attending: Emergency Medicine | Admitting: Emergency Medicine

## 2013-01-17 DIAGNOSIS — E669 Obesity, unspecified: Secondary | ICD-10-CM

## 2013-01-17 DIAGNOSIS — Z9861 Coronary angioplasty status: Secondary | ICD-10-CM | POA: Insufficient documentation

## 2013-01-17 DIAGNOSIS — K458 Other specified abdominal hernia without obstruction or gangrene: Secondary | ICD-10-CM

## 2013-01-17 DIAGNOSIS — E119 Type 2 diabetes mellitus without complications: Secondary | ICD-10-CM

## 2013-01-17 DIAGNOSIS — Z951 Presence of aortocoronary bypass graft: Secondary | ICD-10-CM | POA: Insufficient documentation

## 2013-01-17 DIAGNOSIS — G8929 Other chronic pain: Secondary | ICD-10-CM | POA: Insufficient documentation

## 2013-01-17 DIAGNOSIS — I2510 Atherosclerotic heart disease of native coronary artery without angina pectoris: Secondary | ICD-10-CM | POA: Insufficient documentation

## 2013-01-17 DIAGNOSIS — Z79899 Other long term (current) drug therapy: Secondary | ICD-10-CM | POA: Insufficient documentation

## 2013-01-17 DIAGNOSIS — K469 Unspecified abdominal hernia without obstruction or gangrene: Secondary | ICD-10-CM | POA: Insufficient documentation

## 2013-01-17 DIAGNOSIS — Z21 Asymptomatic human immunodeficiency virus [HIV] infection status: Secondary | ICD-10-CM | POA: Insufficient documentation

## 2013-01-17 DIAGNOSIS — Z7982 Long term (current) use of aspirin: Secondary | ICD-10-CM | POA: Insufficient documentation

## 2013-01-17 DIAGNOSIS — Z87891 Personal history of nicotine dependence: Secondary | ICD-10-CM | POA: Insufficient documentation

## 2013-01-17 DIAGNOSIS — I10 Essential (primary) hypertension: Secondary | ICD-10-CM

## 2013-01-17 MED ORDER — OXYCODONE-ACETAMINOPHEN 5-325 MG PO TABS
2.0000 | ORAL_TABLET | Freq: Once | ORAL | Status: AC
  Administered 2013-01-18: 2 via ORAL
  Filled 2013-01-17: qty 2

## 2013-01-17 NOTE — ED Provider Notes (Signed)
CSN: [id number]     Arrival date & time 01/17/13  2325 History   First MD Initiated Contact with Patient 01/17/13 2346     Chief Complaint  Patient presents with  . Back Pain   (Consider location/radiation/quality/duration/timing/severity/associated sxs/prior Treatment) HPI Katie Mccarthy is a very pleasant 62 yo diabetic woman who is HIV positive, has poorly controlled HTN and a history of CAD - s/p CABG.   She presents from home with complaints of pain in the SI and gluteal region bilaterally x approximately 1 week. She first noticed sx when she awoke in the morning a week or so ago. Her pain is always most severe in the mornings when she first rises. She says pain radiates inferiorly down the posterior aspect of both thighs. She feels best when she is laying in the decubitus position. She denies any new paresthesias or motor weakness.   No bladder or bowel incontinence. No fever. The patient says she has chronic LBP but that this pain is different. She describes pain as aching and severe (at worst). She is currently pain free while laying in the left lateral decubitus position.   Patient took a muscle relaxant given to her by a friend (medication name unknown). She took one of these pills last night and another this evening around 1800h. She says, "at first it was helping but then it felt like it wasn't really doing anything". In addition, the patient has taken Excedrin.   Patient is noted to be hypertensive with BP of 210/105 on arrival. But, BP has improved to 166/92 at the time of my exam. The patient denies cp, sob, headache. She reports compliance with her multiple antihypertensive medications and denies any recent medication changes. She says, "no matter what I do, my blood pressure is always high like that".   Past Medical History  Diagnosis Date  . Coronary artery disease   . Hypertension   . Diabetes mellitus without complication   . HIV infection    Past Surgical History  Procedure  Laterality Date  . Cardiac catheterization    . Coronary artery bypass graft     Family History  Problem Relation Age of Onset  . Diabetes Mother   . Heart disease Mother   . Diabetes Father   . Coronary artery disease Father   . Diabetes Sister   . Diabetes Brother    History  Substance Use Topics  . Smoking status: Former Smoker    Types: Cigarettes    Quit date: 11/23/2010  . Smokeless tobacco: Never Used  . Alcohol Use: No   OB History   Grav Para Term Preterm Abortions TAB SAB Ect Mult Living                 Review of Systems 10 point ROS obtained and negative with the exception of sx noted above.   Allergies  Review of patient's allergies indicates no known allergies.  Home Medications   Current Outpatient Rx  Name  Route  Sig  Dispense  Refill  . amLODipine (NORVASC) 10 MG tablet   Oral   Take 10 mg by mouth daily.         Marland Kitchen aspirin 81 MG chewable tablet   Oral   Chew 81 mg by mouth daily. Took 2 tablets in morning, 2 more around lunch time, then EMS told her to take 4 more tablets-only for today 08/20/12 (CP).         . cloNIDine (CATAPRES) 0.1 MG tablet  Oral   Take 0.1 mg by mouth 2 (two) times daily.         Marland Kitchen efavirenz-emtricitabine-tenofovir (ATRIPLA) 600-200-300 MG per tablet   Oral   Take 1 tablet by mouth at bedtime.         . furosemide (LASIX) 20 MG tablet   Oral   Take 20 mg by mouth daily.          Marland Kitchen gabapentin (NEURONTIN) 100 MG capsule   Oral   Take 300 mg by mouth every evening.          . isosorbide mononitrate (IMDUR) 60 MG 24 hr tablet   Oral   Take 60 mg by mouth daily.         Marland Kitchen lisinopril (PRINIVIL,ZESTRIL) 20 MG tablet   Oral   Take 20 mg by mouth daily.          . metFORMIN (GLUCOPHAGE) 500 MG tablet   Oral   Take 500 mg by mouth daily with breakfast.         . metoprolol succinate (TOPROL-XL) 50 MG 24 hr tablet   Oral   Take 50 mg by mouth daily. Take with or immediately following a meal.          . oxyCODONE-acetaminophen (PERCOCET/ROXICET) 5-325 MG per tablet   Oral   Take 1 tablet by mouth every 6 (six) hours as needed for pain.   20 tablet   0   . predniSONE (DELTASONE) 20 MG tablet   Oral   Take 2 tablets (40 mg total) by mouth daily. For 5 days; then 1 tablet daily x 5 days   15 tablet   0    BP 157/128  Pulse 69  Temp(Src) 97.6 F (36.4 C) (Oral)  Resp 18  SpO2 100% Physical Exam Gen: well developed and well nourished appearing the right lateral decubitus position, does not appear to be in distress or pain. Pleasant and conversant. Head: NCAT Eyes: PERL, EOMI Nose: no epistaixis or rhinorrhea Mouth/throat: mucosa is moist and pink Neck: supple, no stridor Lungs: CTA B, no wheezing, rhonchi or rales CV: Regular rate and rhythm, empties well perfused x4 Abd: soft, obese, notender, nondistended Back: Mild kyphosis noted, no noted ttp, no cva ttp, ttp over the SI regions bilaterally with ttp over the proximal gluteal musculature as well Skin: warm and dry Ext: normal to inspection Neuro: CN ii-xii grossly intact, no focal deficits, 5/5 strength both lower legs - all major m.groups, patellar reflexes are brisk and symmetric, sensation intact to light touch bilaterally Psyche; normal affect,  calm and cooperative.  ED Course  Procedures (including critical care time)   MDM   Patient with symptoms consistent with sciatica. No signs or symptoms of acute cord compression. VS are normal.  Patient has received screening exam for [facility]. I believe that her sx are most consistent with sciatica. We will prescribe Soma, brief course of nsaid and Tramadol.  Patient counseled re: some basic stretching excersizes. Advised of BP level on arrival and need for f/u with her PCP - Dr. Karleen Hampshire, in the next 1-2 days.    Brandt Loosen, MD 01/18/13 [phone]

## 2013-01-17 NOTE — ED Notes (Signed)
C/o lower back/buttocks pain that radiates down bilateral legs x 1 week.  No known injury.  Ambulatory to triage.

## 2013-01-17 NOTE — ED Notes (Signed)
Pt states she is here for lower back pain that radiates to bilateral legs. States she has always had back problems but has had this radiating pain for about a week. Pt states she tried taking muscle relaxer's to no avail.

## 2013-01-18 MED ORDER — TRAMADOL HCL 50 MG PO TABS: 50.0000 mg | ORAL_TABLET | Freq: Four times a day (QID) | ORAL | Status: DC | PRN

## 2013-01-18 MED ORDER — IBUPROFEN 600 MG PO TABS: 600.0000 mg | ORAL_TABLET | Freq: Four times a day (QID) | ORAL | Status: DC | PRN

## 2013-01-18 MED ORDER — CYCLOBENZAPRINE HCL 10 MG PO TABS: 10.0000 mg | ORAL_TABLET | Freq: Two times a day (BID) | ORAL | Status: DC | PRN

## 2013-01-20 ENCOUNTER — Telehealth: Payer: Self-pay | Admitting: *Deleted

## 2013-01-20 ENCOUNTER — Encounter: Payer: Self-pay | Admitting: Internal Medicine

## 2013-01-20 ENCOUNTER — Ambulatory Visit (INDEPENDENT_AMBULATORY_CARE_PROVIDER_SITE_OTHER): Payer: Medicaid Other | Admitting: Internal Medicine

## 2013-01-20 VITALS — BP 154/103 | HR 76 | Temp 97.7°F | Ht 69.0 in | Wt 220.8 lb

## 2013-01-20 DIAGNOSIS — I25810 Atherosclerosis of coronary artery bypass graft(s) without angina pectoris: Secondary | ICD-10-CM

## 2013-01-20 DIAGNOSIS — M5430 Sciatica, unspecified side: Secondary | ICD-10-CM

## 2013-01-20 MED ORDER — GABAPENTIN 300 MG PO CAPS: 300.0000 mg | ORAL_CAPSULE | Freq: Three times a day (TID) | ORAL | Status: DC

## 2013-01-20 NOTE — Telephone Encounter (Signed)
Pt has [employer].  Needing referral from PCP, Hunt[place] Dec C. Karleen Hampshire, PA at [address] [facility].  Left telephone message requesting referral and call back to [facility] to let Dr. Drue Second know the response.

## 2013-01-20 NOTE — Progress Notes (Signed)
[facility] HIV CLINIC NOTE  RFV: routine Subjective:    Patient ID: Katie Mccarthy, female    DOB: 13-Jan-1951, 62 y.o.   MRN: [id number]  HPI 62yo F with HIV, CD 4 count 660/VL <20, on atripla, not missing any dose. Also has history of CA s/p CABG, HTN, HLD,, DM and gout. She is noticing worsening sciatica been managed by PCP. Back pain shoots down legs, feels like lightening, and would like better symptom relief. Wondering if she could have cardiology referral to person in town.  Current Outpatient Prescriptions on File Prior to Visit  Medication Sig Dispense Refill  . amLODipine (NORVASC) 10 MG tablet Take 10 mg by mouth daily.      Marland Kitchen aspirin 81 MG chewable tablet Chew 81 mg by mouth daily. Took 2 tablets in morning, 2 more around lunch time, then EMS told her to take 4 more tablets-only for today 08/20/12 (CP).      Marland Kitchen aspirin-acetaminophen-caffeine (EXCEDRIN MIGRAINE) 250-250-65 MG per tablet Take 1 tablet by mouth every 6 (six) hours as needed for pain.      . cloNIDine (CATAPRES) 0.1 MG tablet Take 0.1 mg by mouth 2 (two) times daily.      . cyclobenzaprine (FLEXERIL) 10 MG tablet Take 1 tablet (10 mg total) by mouth 2 (two) times daily as needed for muscle spasms.  20 tablet  0  . efavirenz-emtricitabine-tenofovir (ATRIPLA) 600-200-300 MG per tablet Take 1 tablet by mouth at bedtime.      . gabapentin (NEURONTIN) 100 MG capsule Take 300 mg by mouth every evening.       Marland Kitchen ibuprofen (ADVIL,MOTRIN) 600 MG tablet Take 1 tablet (600 mg total) by mouth every 6 (six) hours as needed for pain.  30 tablet  0  . isosorbide mononitrate (IMDUR) 60 MG 24 hr tablet Take 60 mg by mouth daily.      Marland Kitchen lisinopril (PRINIVIL,ZESTRIL) 20 MG tablet Take 20 mg by mouth daily.       . metFORMIN (GLUCOPHAGE) 500 MG tablet Take 500 mg by mouth daily with breakfast.      . metoprolol succinate (TOPROL-XL) 50 MG 24 hr tablet Take 50 mg by mouth daily. Take with or immediately following a meal.      . traMADol (ULTRAM) 50  MG tablet Take 1 tablet (50 mg total) by mouth every 6 (six) hours as needed for pain.  15 tablet  0   No current facility-administered medications on file prior to visit.   Active Ambulatory Problems    Diagnosis Date Noted  . HIV (human immunodeficiency virus infection) 04/27/2012  . Gout 04/27/2012  . Hypertension 04/27/2012  . Diabetes 04/27/2012  . Asthma 04/27/2012  . Hypercholesteremia 04/27/2012  . CAD (coronary artery disease) 04/27/2012  . Hx of CABG 04/27/2012  . LSIL (low grade squamous intraepithelial lesion) on Pap smear 12/30/2012   Resolved Ambulatory Problems    Diagnosis Date Noted  . No Resolved Ambulatory Problems   Past Medical History  Diagnosis Date  . Coronary artery disease   . Diabetes mellitus without complication   . HIV infection       Review of Systems 12 point ROS is negative except for sciatica    Objective:   Physical Exam BP 154/103  Pulse 76  Temp(Src) 97.7 F (36.5 C) (Oral)  Ht 5\' 9"  (1.753 m)  Wt 220 lb 12 oz (100.132 kg)  BMI 32.58 kg/m2 Physical Exam  Constitutional: oriented to person, place, and time.  appears  well-developed and well-nourished. No distress.  HENT:  Mouth/Throat: Oropharynx is clear and moist. No oropharyngeal exudate.  Cardiovascular: Normal rate, regular rhythm and normal heart sounds. Exam reveals no gallop and no friction rub.  No murmur heard.  Pulmonary/Chest: Effort normal and breath sounds normal. No respiratory distress.  no wheezes.  Abdominal: Soft. Bowel sounds are normal.  exhibits no distension. There is no tenderness.  Lymphadenopathy:   no cervical adenopathy.  Neurological:  alert and oriented to person, place, and time.  Skin: Skin is warm and dry. No rash noted. No erythema.  Psychiatric:  a normal mood and affect. behavior is normal.        Assessment & Plan:  HIV = continue with atripla  Back pain/sciaitica = will give rx for physical therapy. And do a trial of neurontin  CAD/  hx of cabg = needs to establish with cardiologist,  Health maintenance = already received flu shot.  rtc in 3 months

## 2013-02-15 ENCOUNTER — Ambulatory Visit (INDEPENDENT_AMBULATORY_CARE_PROVIDER_SITE_OTHER): Payer: Medicaid Other | Admitting: Obstetrics & Gynecology

## 2013-02-15 ENCOUNTER — Other Ambulatory Visit (HOSPITAL_COMMUNITY)
Admission: RE | Admit: 2013-02-15 | Discharge: 2013-02-15 | Disposition: A | Discharge status: Home / Self Care | Payer: Medicare Other | Source: Ambulatory Visit | Attending: Obstetrics & Gynecology | Admitting: Obstetrics & Gynecology

## 2013-02-15 ENCOUNTER — Encounter: Payer: Self-pay | Admitting: Obstetrics & Gynecology

## 2013-02-15 VITALS — BP 140/92 | HR 83 | Temp 96.7°F | Wt 223.0 lb

## 2013-02-15 DIAGNOSIS — B20 Human immunodeficiency virus [HIV] disease: Secondary | ICD-10-CM

## 2013-02-15 DIAGNOSIS — R87612 Low grade squamous intraepithelial lesion on cytologic smear of cervix (LGSIL): Secondary | ICD-10-CM | POA: Insufficient documentation

## 2013-02-15 NOTE — Patient Instructions (Signed)
Endocervical Curettage The cervix is the neck or lower part of the uterus. It protrudes into the top of the vagina. The front or outer surface of the cervix has squamous cells. The canal (endocervix) opens into the uterus and has columnar cells and glands. Different conditions can cause changes in these cells.  An ECC is done to get a tissue sample from the endocervical canal to look for abnormal cells. This procedure is sometimes done as part of an exam called a colposcopy, in which your caregiver takes a close look at the surface of your cervix because of an abnormal Pap test result, the presence of genital warts, bleeding or pain. This procedure can also be done when doing a D & C. Or, you might have chosen endocervical curettage to find out more about abnormal results of a previous colposcopy.  LET YOUR CAREGIVER KNOW ABOUT:  If you have a recent vaginal infection.  If you have a menstrual period or are bleeding.  If you are allergic or had a reaction to anesthetics.  If you are allergic to any medications.  If you are taking medications. This includes herbs, over-the-counter drugs, eye drops, creams and steroids.  If there is a possibility of being pregnant.  If you have had any past problems with your cervix. RISKS AND COMPLICATIONS   Excessive bleeding.  Infection.  Injury to surrounding organs.  Allergic reaction to anesthetics or medications. BEFORE THE PROCEDURE  Do not take aspirin or blood thinners a week before the procedure. These can cause bleeding.  Do not douche or use tampons for at least 3 days before the procedure.  Do not have sexual intercourse at least three days before the procedure.  Arrive at the office or clinic one hour before the procedure to read and sign any forms and consents. PROCEDURE   For this procedure you will be asked to undress from the waist down. You will need to lie on an exam table with your feet in stirrups. Your legs and belly will  be covered with a sheet.  A speculum will be used to open the walls of your vagina  A drug that numbs the area (local anesthetic) may be used.  A sharp curved-like instrument will scrape cells from the canal, cervix or endocervix.  Medicines may put on the surface of the tissue to stop any bleeding.  The scraped tissue sample is sent to the lab (separate from the biopsy that was taken from the front of the cervix) to see if there are any abnormalities. AFTER THE PROCEDURE   You may have some mild cramping pain.  You will rest in the office or clinic until you are stable and feel ready to go home.  Have someone take you home.  You may go back to your usual diet.  You should be able to go to work the next day. HOME CARE INSTRUCTIONS   Take medications and follow your caregiver's instructions.  Do not take aspirin because it can cause bleeding.  Do not drive until the next day.  Do not douche, use tampons or have sexual intercourse until your caregiver says it is OK to do so.  Take your temperature twice a day. If it is 100 F (37.8 C) or higher, call your caregiver.  A small amount of bloody spotting is normal and will go away in a couple of days. SEEK MEDICAL CARE IF:   You need stronger medication to control your pain.  You develop abnormal, heavy  or bad smelling vaginal discharge.  You get a rash. SEEK IMMEDIATE MEDICAL CARE IF:   Bleeding is heavy like a menstrual period.  You develop a temperature over 102 F (38.9 C) or higher, especially if you also have chills.  You become lightheaded, weak or faint.  You develop shortness of breath. Document Released: 01/30/2008 Document Revised: 07/15/2011 Document Reviewed: 01/30/2008 [facility] Patient Information 2014 [place], [place]. Colposcopy Colposcopy is a procedure that uses a special lighted microscope (colposcope). It examines your cervix and vagina, or the area around the outside of the vagina, for signs of  disease or abnormalities in the cells. You may be sent to a specialist (gynecologist) to do the colposcopy. A biopsy (tissue sample) may be collected during a colposcopy, if the caregiver finds any unusual cells. The biopsy is sent to the lab for further testing, and the results are reported back to your caregiver. A WOMAN MAY NEED THIS PROCEDURE IF:  She has had an abnormal pap smear (taking cells from the cervix for testing).  She has a sore on her cervix, and a Pap test was normal.  The Pap test suggests human papilloma virus (HPV). This virus can cause genital warts and is linked to the development of cervical cancer.  She has genital warts on the cervix, or in or around the outside of the vagina.  Her mother took the drug DES while pregnant.  She has painful intercourse.  She has vaginal bleeding, especially after sexual intercourse.  There is a need to evaluate the results of previous treatment. BEFORE THE PROCEDURE   Colposcopy is done when you are not having a menstrual period.  For 24 hours before the colposcopy, do not:  Douche.  Use tampons.  Use medicines, creams, or suppositories in the vagina.  Have sexual intercourse. PROCEDURE   A colposcopy is done while a woman is lying on her back with her feet in foot rests (stirrups).  A speculum is placed inside the vagina to keep it open and to allow the caregiver to see the cervix. This is the same instrument used to do a pap smear.  The colposcope is placed outside the vagina. It is used to magnify and examine the cervix, vagina, and the area around the outside of the vagina.  A small amount of liquid solution is placed on the area that is to be viewed. This solution is placed on with a cotton applicator. This solution makes it easier to see the abnormal cells.  Your caregiver will suck out mucus and cells from the canal of the cervix.  Small pieces of tissue for biopsy may be taken at the same time. You may feel  mild pain or discomfort when this is done.  Your caregiver will record the location of the abnormal areas and send the tissue samples to a lab for analysis.  If your caregiver biopsies the vagina or outside of the vagina, a local anesthetic (novocaine) is usually given. AFTER THE PROCEDURE   You may have some cramping that often goes away in a few minutes. You may have some soreness for a couple of days.  You may take over-the-counter pain medicine as advised by your caregiver. Do not take aspirin because it can cause bleeding.  Lie down for a few minutes if you feel lightheaded.  You may have some bleeding or dark discharge that should stop in a few days.  You may need to wear a sanitary pad for a few days. HOME CARE INSTRUCTIONS  Avoid sex, douching, and using tampons for a week or as directed.  Only take medicine as directed by your caregiver.  Continue to take birth control pills, if you are on them.  Not all test results are available during your visit. If your test results are not back during the visit, make an appointment with your caregiver to find out the results. Do not assume everything is normal if you have not heard from your caregiver or the medical facility. It is important for you to follow up on all of your test results.  Follow your caregiver's advice regarding medicines, activity, follow-up visits, and follow-up Pap tests. SEEK MEDICAL CARE IF:   You develop a rash.  You have problems with your medicine. SEEK IMMEDIATE MEDICAL CARE IF:  You are bleeding heavily or are passing blood clots.  You develop a fever over 102 F (38.9 C), with or without chills.  You have abnormal vaginal discharge.  You are having cramps that do not go away after taking your pain medicine.  You feel lightheaded, dizzy, or faint.  You develop stomach pain. Document Released: 07/13/2002 Document Revised: 07/15/2011 Document Reviewed: 02/23/2009 [facility] Patient Information  2014 [employer], [place].

## 2013-02-15 NOTE — Progress Notes (Signed)
Patient ID: Katie Mccarthy, female   DOB: July 29, 1950, 62 y.o.   MRN: [id number] Patient given informed consent, signed copy in the chart, time out was performed.  Placed in lithotomy position. Cervix viewed with speculum and colposcope after application of acetic acid.  Diagnosis  12/28/2012:LOW GRADE SQUAMOUS INTRAEPITHELIAL LESION: CIN-1/ HPV (LSIL).  Colposcopy adequate?  Ye.  TZ noted after endocervical spec used Acetowhite lesions?no Punctation?no Mosaicism?  no Abnormal vasculature?  no Biopsies?no ECC?yes  Patient was given post procedure instructions.  She will return in 2 weeks for results.  Guilianna Mckoy L. Harraway-Smith, M.D., [facility]

## 2013-02-19 ENCOUNTER — Telehealth: Payer: Self-pay | Admitting: *Deleted

## 2013-02-19 NOTE — Telephone Encounter (Addendum)
Spoke to patient and notified of result below. Patient satisfied and states understanding to repeat pap in one year.     Message copied by Jill Side on Fri Feb 19, 2013 11:30 AM ------      Message from: Willodean Rosenthal      Created: Fri Feb 19, 2013  8:31 AM       Please call pt. Her biopsy at colpo came back benign.  She should f/u in 1 year for a repeat pap            clh-S ------  Called pt and left message that I am calling with test result information. Please call back to the nurse voice mail and leave a message stating whether we leave the result details on your voice mail.

## 2013-02-24 ENCOUNTER — Encounter: Payer: Self-pay | Admitting: *Deleted

## 2013-03-19 ENCOUNTER — Ambulatory Visit: Payer: Self-pay | Admitting: Obstetrics & Gynecology

## 2013-04-08 ENCOUNTER — Other Ambulatory Visit: Payer: Self-pay

## 2013-04-13 ENCOUNTER — Telehealth: Payer: Self-pay | Admitting: Licensed Clinical Social Worker

## 2013-04-13 NOTE — Telephone Encounter (Signed)
Patient states that she is waiting on a  referral to Cardiology, it appears to be an order to Dr. Eden Emms but patient did not receive a call.

## 2013-04-13 NOTE — Telephone Encounter (Signed)
Called Cardiology to get the patient an appt and was given 04/16/13 at 930, at [address] [place], [phone]. Called the patient and gave her the appt time, address, and phone number.

## 2013-04-14 NOTE — Telephone Encounter (Signed)
Thanks Travis 

## 2013-04-16 ENCOUNTER — Encounter: Payer: Self-pay | Admitting: Cardiovascular Disease

## 2013-04-16 ENCOUNTER — Ambulatory Visit (INDEPENDENT_AMBULATORY_CARE_PROVIDER_SITE_OTHER): Payer: Medicare Other | Admitting: Cardiovascular Disease

## 2013-04-16 VITALS — BP 136/74 | HR 96 | Wt 229.0 lb

## 2013-04-16 DIAGNOSIS — I10 Essential (primary) hypertension: Secondary | ICD-10-CM

## 2013-04-16 DIAGNOSIS — E7800 Pure hypercholesterolemia, unspecified: Secondary | ICD-10-CM

## 2013-04-16 DIAGNOSIS — E119 Type 2 diabetes mellitus without complications: Secondary | ICD-10-CM

## 2013-04-16 DIAGNOSIS — B20 Human immunodeficiency virus [HIV] disease: Secondary | ICD-10-CM

## 2013-04-16 DIAGNOSIS — I2510 Atherosclerotic heart disease of native coronary artery without angina pectoris: Secondary | ICD-10-CM

## 2013-04-16 DIAGNOSIS — Z21 Asymptomatic human immunodeficiency virus [HIV] infection status: Secondary | ICD-10-CM

## 2013-04-16 NOTE — Assessment & Plan Note (Addendum)
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

## 2013-04-16 NOTE — Assessment & Plan Note (Signed)
Cholesterol is at goal.  Continue current dose of statin and diet Rx.  No myalgias or side effects.  F/U  LFT's in 6 months. Lab Results  Component Value Date   LDLCALC 125* 04/16/2012   Needs repeat labs by primary

## 2013-04-16 NOTE — Assessment & Plan Note (Signed)
F/U ID  On atripla CD 4 count stable

## 2013-04-16 NOTE — Progress Notes (Signed)
Patient ID: Katie Mccarthy, female   DOB: 02-03-1951, 62 y.o.   MRN: [id number]   62 yo with elevated lipids, DM, HTN and HIV  She moved her to be with "girlfriend"  History of CAD Unfortunately no records. Appears to have had previous stents as far back as 2013.  The [facility]  She denies having chest pain before all this and it is unclear what ppt cardiac w/u.  She then indicates that she needed more stents after her CABG.  She is overweight Mild exertional dyspnea. No palpitations syncope or chest pain  Compliant with meds.  Does not check BS regularly and has poor diet Last cd4 count 744 with no opportunistic infections.  NO record of EF but also no history of CHF    ROS: Denies fever, malais, weight loss, blurry vision, decreased visual acuity, cough, sputum, SOB, hemoptysis, pleuritic pain, palpitaitons, heartburn, abdominal pain, melena, lower extremity edema, claudication, or rash.  All other systems reviewed and negative   General: Affect appropriate Obese black female  HEENT: normal Neck supple with no adenopathy JVP normal no bruits no thyromegaly Lungs clear with no wheezing and good diaphragmatic motion Heart:  S1/S2 no murmur,rub, gallop or click PMI normal Abdomen: benighn, BS positve, no tenderness, no AAA no bruit.  No HSM or HJR Distal pulses intact with no bruits No edema Neuro non-focal Skin warm and dry Mild keloid over sternotomy site No muscular weakness  Medications Current Outpatient Prescriptions  Medication Sig Dispense Refill  . amLODipine (NORVASC) 10 MG tablet Take 10 mg by mouth daily.      Marland Kitchen aspirin 81 MG chewable tablet Chew 81 mg by mouth daily. Took 2 tablets in morning, 2 more around lunch time, then EMS told her to take 4 more tablets-only for today 08/20/12 (CP).      Marland Kitchen aspirin-acetaminophen-caffeine (EXCEDRIN MIGRAINE) 250-250-65 MG per tablet Take 1 tablet by mouth every 6 (six) hours as needed for pain.      .  cloNIDine (CATAPRES) 0.1 MG tablet Take 0.1 mg by mouth 2 (two) times daily.      . cyclobenzaprine (FLEXERIL) 10 MG tablet Take 1 tablet (10 mg total) by mouth 2 (two) times daily as needed for muscle spasms.  20 tablet  0  . efavirenz-emtricitabine-tenofovir (ATRIPLA) 600-200-300 MG per tablet Take 1 tablet by mouth at bedtime.      . gabapentin (NEURONTIN) 300 MG capsule Take 1 capsule (300 mg total) by mouth 3 (three) times daily. Start with 1 tablet twice a day x 5 days then 1 tab three times per day  90 capsule  4  . ibuprofen (ADVIL,MOTRIN) 600 MG tablet Take 1 tablet (600 mg total) by mouth every 6 (six) hours as needed for pain.  30 tablet  0  . isosorbide mononitrate (IMDUR) 60 MG 24 hr tablet Take 60 mg by mouth daily.      Marland Kitchen lisinopril (PRINIVIL,ZESTRIL) 20 MG tablet Take 20 mg by mouth daily.       . metFORMIN (GLUCOPHAGE) 500 MG tablet Take 500 mg by mouth daily with breakfast.      . metoprolol succinate (TOPROL-XL) 50 MG 24 hr tablet Take 50 mg by mouth daily. Take with or immediately following a meal.      . traMADol (ULTRAM) 50 MG tablet Take 1 tablet (50 mg total) by mouth every 6 (six) hours as needed for pain.  15 tablet  0   No current  facility-administered medications for this visit.    Allergies Review of patient's allergies indicates no known allergies.  Family History: Family History  Problem Relation Age of Onset  . Diabetes Mother   . Heart disease Mother   . Diabetes Father   . Coronary artery disease Father   . Diabetes Sister   . Diabetes Brother     Social History: History   Social History  . Marital Status: Married    Spouse Name: N/A    Number of Children: N/A  . Years of Education: N/A   Occupational History  . Not on file.   Social History Main Topics  . Smoking status: Former Smoker    Types: Cigarettes    Quit date: 11/23/2010  . Smokeless tobacco: Never Used  . Alcohol Use: No  . Drug Use: No  . Sexual Activity: Yes    Partners:  Male    Birth Control/ Protection: Condom   Other Topics Concern  . Not on file   Social History Narrative  . No narrative on file    Electrocardiogram:  4/19/  2014  SR rate 83 T wave inversion I and AVL   Assessment and Plan

## 2013-04-16 NOTE — Assessment & Plan Note (Signed)
Discussed low carb diet.  Target hemoglobin A1c is 6.5 or less.  Continue current medications. She seems to have limited understanding of disease

## 2013-04-16 NOTE — Assessment & Plan Note (Signed)
Well controlled.  Continue current medications and low sodium Dash type diet.    

## 2013-04-16 NOTE — Patient Instructions (Signed)
Your physician wants you to follow-up in:  6 MONTHS WITH DR NISHAN  You will receive a reminder letter in the mail two months in advance. If you don't receive a letter, please call our office to schedule the follow-up appointment. Your physician recommends that you continue on your current medications as directed. Please refer to the Current Medication list given to you today. 

## 2013-04-22 ENCOUNTER — Ambulatory Visit: Payer: Self-pay | Admitting: Internal Medicine

## 2013-05-28 ENCOUNTER — Other Ambulatory Visit: Payer: Self-pay | Admitting: Internal Medicine

## 2013-06-09 NOTE — Progress Notes (Deleted)
   Subjective:    Patient ID: Katie Mccarthy, female    DOB: [birth year], 63 y.o.   MRN: [id number]  HPI    Review of Systems     Objective:   Physical Exam   Labs:    Assessment & Plan:

## 2013-06-10 ENCOUNTER — Ambulatory Visit (INDEPENDENT_AMBULATORY_CARE_PROVIDER_SITE_OTHER): Payer: Medicare Other | Admitting: Internal Medicine

## 2013-06-10 ENCOUNTER — Encounter: Payer: Self-pay | Admitting: Internal Medicine

## 2013-06-10 VITALS — BP 184/133 | HR 89 | Temp 97.8°F | Wt 228.0 lb

## 2013-06-10 DIAGNOSIS — K649 Unspecified hemorrhoids: Secondary | ICD-10-CM

## 2013-06-10 DIAGNOSIS — K5900 Constipation, unspecified: Secondary | ICD-10-CM

## 2013-06-10 DIAGNOSIS — B20 Human immunodeficiency virus [HIV] disease: Secondary | ICD-10-CM

## 2013-06-10 DIAGNOSIS — I10 Essential (primary) hypertension: Secondary | ICD-10-CM

## 2013-06-10 DIAGNOSIS — Z23 Encounter for immunization: Secondary | ICD-10-CM

## 2013-06-10 DIAGNOSIS — Z0000 Encounter for general adult medical examination without abnormal findings: Secondary | ICD-10-CM

## 2013-06-10 LAB — COMPLETE METABOLIC PANEL WITH GFR
ALBUMIN: 3.8 g/dL (ref 3.5–5.2)
ALT: 11 U/L (ref 0–35)
AST: 15 U/L (ref 0–37)
Alkaline Phosphatase: 79 U/L (ref 39–117)
BUN: 15 mg/dL (ref 6–23)
CHLORIDE: 106 meq/L (ref 96–112)
CO2: 25 mEq/L (ref 19–32)
CREATININE: 0.8 mg/dL (ref 0.50–1.10)
Calcium: 9.5 mg/dL (ref 8.4–10.5)
GFR, EST NON AFRICAN AMERICAN: 80 mL/min
GFR, Est African American: >89 mL/min
GLUCOSE: 92 mg/dL (ref 70–99)
POTASSIUM: 3.4 meq/L — AB (ref 3.5–5.3)
Sodium: 141 mEq/L (ref 135–145)
Total Bilirubin: 0.2 mg/dL (ref 0.2–1.2)
Total Protein: 7 g/dL (ref 6.0–8.3)

## 2013-06-10 LAB — CBC WITH DIFFERENTIAL/PLATELET
BASOS PCT: 0 % (ref 0–1)
Basophils Absolute: 0 10*3/uL (ref 0.0–0.1)
Eosinophils Absolute: 0.2 10*3/uL (ref 0.0–0.7)
Eosinophils Relative: 3 % (ref 0–5)
HEMATOCRIT: 37 % (ref 36.0–46.0)
Hemoglobin: 12.2 g/dL (ref 12.0–15.0)
LYMPHS PCT: 42 % (ref 12–46)
Lymphs Abs: 2.3 10*3/uL (ref 0.7–4.0)
MCH: 26.5 pg (ref 26.0–34.0)
MCHC: 33 g/dL (ref 30.0–36.0)
MCV: 80.4 fL (ref 78.0–100.0)
MONO ABS: 0.3 10*3/uL (ref 0.1–1.0)
MONOS PCT: 5 % (ref 3–12)
NEUTROS ABS: 2.7 10*3/uL (ref 1.7–7.7)
Neutrophils Relative %: 50 % (ref 43–77)
Platelets: 290 10*3/uL (ref 150–400)
RBC: 4.6 MIL/uL (ref 3.87–5.11)
RDW: 14.8 % (ref 11.5–15.5)
WBC: 5.4 10*3/uL (ref 4.0–10.5)

## 2013-06-10 NOTE — Progress Notes (Signed)
Subjective:    Patient ID: Katie Mccarthy, female    DOB: 1951/04/25, 63 y.o.   MRN: [id number]  HPI 63yo F with HIV, CD 4 count 770/VL<20. On atripla. Hasn't taken her meds this morning since she was rushing out of house. She thinks her crestor is causing her constipation despite taking stool softener. Notices having hemorrhoidal bleeding only with straining BM, which occurs roughly 1-2 x/wk. She noticed having hemorrhoids after her pregnancies but struggles with having management of hemorrhoidal bleeding and constipation.   Current Outpatient Prescriptions on File Prior to Visit  Medication Sig Dispense Refill  . amLODipine (NORVASC) 10 MG tablet Take 10 mg by mouth daily.      Marland Kitchen. aspirin 81 MG chewable tablet Chew 81 mg by mouth daily. Took 2 tablets in morning, 2 more around lunch time, then EMS told her to take 4 more tablets-only for today 08/20/12 (CP).      . Aspirin-Acetaminophen-Caffeine (EXCEDRIN PO) Take by mouth as needed.      . cloNIDine (CATAPRES) 0.1 MG tablet Take 0.1 mg by mouth 2 (two) times daily.      Marland Kitchen. efavirenz-emtricitabine-tenofovir (ATRIPLA) 600-200-300 MG per tablet Take 1 tablet by mouth at bedtime.      . gabapentin (NEURONTIN) 300 MG capsule TAKE 1 CAPSULE BY MOUTH TWICE DAILY FOR 5 DAYS THEN 1 CAPSULE THREE TIMES A DAY  90 capsule  2  . ibuprofen (ADVIL,MOTRIN) 600 MG tablet Take 1 tablet (600 mg total) by mouth every 6 (six) hours as needed for pain.  30 tablet  0  . isosorbide mononitrate (IMDUR) 60 MG 24 hr tablet Take 60 mg by mouth daily.      Marland Kitchen. lisinopril (PRINIVIL,ZESTRIL) 20 MG tablet Take 20 mg by mouth daily.       . metFORMIN (GLUCOPHAGE) 500 MG tablet Take 500 mg by mouth daily with breakfast.      . metoprolol succinate (TOPROL-XL) 50 MG 24 hr tablet Take 50 mg by mouth daily. Take with or immediately following a meal.      . prasugrel (EFFIENT) 10 MG TABS tablet Take 10 mg by mouth daily.      . rosuvastatin (CRESTOR) 20 MG tablet Take 20 mg by  mouth daily.      Marland Kitchen. tiZANidine (ZANAFLEX) 4 MG tablet Take 4 mg by mouth 3 (three) times daily.       No current facility-administered medications on file prior to visit.      Review of Systems  Constitutional: Negative for fever, chills, diaphoresis, activity change, appetite change, fatigue and unexpected weight change.  HENT: Negative for congestion, sore throat, rhinorrhea, sneezing, trouble swallowing and sinus pressure.  Eyes: Negative for photophobia and visual disturbance.  Respiratory: Negative for cough, chest tightness, shortness of breath, wheezing and stridor.  Cardiovascular: Negative for chest pain, palpitations and leg swelling.  Gastrointestinal: Negative for nausea, vomiting, abdominal pain, diarrhea, constipation, blood in stool, abdominal distention and anal bleeding.  Genitourinary: Negative for dysuria, hematuria, flank pain and difficulty urinating.  Musculoskeletal: Negative for myalgias, back pain, joint swelling, arthralgias and gait problem.  Skin: Negative for color change, pallor, rash and wound.  Neurological: Negative for dizziness, tremors, weakness and light-headedness.  Hematological: Negative for adenopathy. Does not bruise/bleed easily.  Psychiatric/Behavioral: Negative for behavioral problems, confusion, sleep disturbance, dysphoric mood, decreased concentration and agitation.       Objective:   Physical Exam BP 184/133  Pulse 89  Temp(Src) 97.8 F (36.6 C) (Oral)  Wt 228 lb (103.42 kg)  Constitutional:  oriented to person, place, and time. appears well-developed and well-nourished. No distress.  HENT:  Mouth/Throat: Oropharynx is clear and moist. No oropharyngeal exudate.  Cardiovascular: Normal rate, regular rhythm and normal heart sounds. Exam reveals no gallop and no friction rub.  No murmur heard.  Pulmonary/Chest: Effort normal and breath sounds normal. No respiratory distress.  has no wheezes.  Abdominal: Soft. Bowel sounds are  normal.  exhibits no distension. There is no tenderness.  Lymphadenopathy: no cervical adenopathy.  Neurological: alert and oriented to person, place, and time.  Skin: Skin is warm and dry. No rash noted. No erythema.       Assessment & Plan:  hiv disease= well controlled. Will check labs today  htn = poorly controlled since she didn't take meds. Will see how she is doing with her taking her meds as directed to see if she still needs it to be titrated to goal at next visit  Constipation = will need to do variety of modification. Will hold crestor for 2 wks as  Test. But also increase colace and start miralax.  Hemorrhoids  = refer to general surgery  Low back pain = getting to see spine center in [place] this week.  Health maintenance = will do hep b series, start #1 today and pneumococcal vax booster  rtc 6 wks

## 2013-06-11 LAB — T-HELPER CELL (CD4) - (RCID CLINIC ONLY)
CD4 T CELL HELPER: 36 % (ref 33–55)
CD4 T Cell Abs: 840 /uL (ref 400–2700)

## 2013-06-11 LAB — HIV-1 RNA QUANT-NO REFLEX-BLD: HIV 1 RNA Quant: <20 copies/mL (ref <20)

## 2013-07-15 ENCOUNTER — Encounter: Payer: Self-pay | Admitting: Internal Medicine

## 2013-07-15 ENCOUNTER — Ambulatory Visit (INDEPENDENT_AMBULATORY_CARE_PROVIDER_SITE_OTHER): Payer: Medicare Other | Admitting: Internal Medicine

## 2013-07-15 VITALS — BP 206/118 | HR 88 | Temp 97.3°F | Wt 221.0 lb

## 2013-07-15 DIAGNOSIS — B20 Human immunodeficiency virus [HIV] disease: Secondary | ICD-10-CM

## 2013-07-15 DIAGNOSIS — Z21 Asymptomatic human immunodeficiency virus [HIV] infection status: Secondary | ICD-10-CM

## 2013-07-15 DIAGNOSIS — E7800 Pure hypercholesterolemia, unspecified: Secondary | ICD-10-CM

## 2013-07-15 DIAGNOSIS — I10 Essential (primary) hypertension: Secondary | ICD-10-CM

## 2013-07-15 NOTE — Progress Notes (Signed)
Subjective:    Patient ID: Katie Mccarthy, female    DOB: Jun 02, 1950, 63 y.o.   MRN: [id number]  HPI 63yo F with HIV, CD 4 count of 840/VL<20, with poorly controlled HTN, CAD s/p CABG. Decided to stop bp meds because she feels slurred speech and sleepiness x 7 days. No ha presently  Current Outpatient Prescriptions on File Prior to Visit  Medication Sig Dispense Refill  . amLODipine (NORVASC) 10 MG tablet Take 10 mg by mouth daily.      Marland Kitchen. aspirin 81 MG chewable tablet Chew 81 mg by mouth daily. Took 2 tablets in morning, 2 more around lunch time, then EMS told her to take 4 more tablets-only for today 08/20/12 (CP).      . Aspirin-Acetaminophen-Caffeine (EXCEDRIN PO) Take by mouth as needed.      . cloNIDine (CATAPRES) 0.1 MG tablet Take 0.1 mg by mouth 2 (two) times daily.      Marland Kitchen. efavirenz-emtricitabine-tenofovir (ATRIPLA) 600-200-300 MG per tablet Take 1 tablet by mouth at bedtime.      . gabapentin (NEURONTIN) 300 MG capsule TAKE 1 CAPSULE BY MOUTH TWICE DAILY FOR 5 DAYS THEN 1 CAPSULE THREE TIMES A DAY  90 capsule  2  . ibuprofen (ADVIL,MOTRIN) 600 MG tablet Take 1 tablet (600 mg total) by mouth every 6 (six) hours as needed for pain.  30 tablet  0  . isosorbide mononitrate (IMDUR) 60 MG 24 hr tablet Take 60 mg by mouth daily.      Marland Kitchen. lisinopril (PRINIVIL,ZESTRIL) 20 MG tablet Take 20 mg by mouth daily.       . metFORMIN (GLUCOPHAGE) 500 MG tablet Take 500 mg by mouth daily with breakfast.      . metoprolol succinate (TOPROL-XL) 50 MG 24 hr tablet Take 50 mg by mouth daily. Take with or immediately following a meal.      . prasugrel (EFFIENT) 10 MG TABS tablet Take 10 mg by mouth daily.      . rosuvastatin (CRESTOR) 20 MG tablet Take 20 mg by mouth daily.      Marland Kitchen. tiZANidine (ZANAFLEX) 4 MG tablet Take 4 mg by mouth 3 (three) times daily.       No current facility-administered medications on file prior to visit.   Active Ambulatory Problems    Diagnosis Date Noted  . HIV (human  immunodeficiency virus infection) 04/27/2012  . Gout 04/27/2012  . Hypertension 04/27/2012  . Diabetes 04/27/2012  . Asthma 04/27/2012  . Hypercholesteremia 04/27/2012  . CAD (coronary artery disease) 04/27/2012  . Hx of CABG 04/27/2012  . LSIL (low grade squamous intraepithelial lesion) on Pap smear 12/30/2012   Resolved Ambulatory Problems    Diagnosis Date Noted  . No Resolved Ambulatory Problems   Past Medical History  Diagnosis Date  . Coronary artery disease   . Diabetes mellitus without complication   . HIV infection   . Sciatica        Review of Systems     Objective:   Physical Exam BP 206/118  Pulse 88  Temp(Src) 97.3 F (36.3 C) (Oral)  Wt 221 lb (100.245 kg) Physical Exam  Constitutional:  oriented to person, place, and time. appears well-developed and well-nourished. No distress.  HENT:  Mouth/Throat: Oropharynx is clear and moist. No oropharyngeal exudate.  Cardiovascular: Normal rate, regular rhythm and normal heart sounds. Exam reveals no gallop and no friction rub.  No murmur heard.  Pulmonary/Chest: Effort normal and breath sounds normal. No respiratory distress.  has no wheezes.  Abdominal: Soft. Bowel sounds are normal.  exhibits no distension. There is no tenderness.  Lymphadenopathy: no cervical adenopathy.  Neurological: alert and oriented to person, place, and time.  Skin: Skin is warm and dry. No rash noted. No erythema.  Psychiatric: a normal mood and affect. His behavior is normal.         Assessment & Plan:  htn = will need to start staggering approach to getting meds back on lisinopril 20mg  today, then start amlodipine tomorrow. We will see her back on Monday to start metop and clonidine. She is to see cardiology on 2/18  Hiv= well controlled  hyperchol = crestor stopped due to constipation, now resolved but need to start other agent  rtc in 5 days

## 2013-07-19 ENCOUNTER — Ambulatory Visit (INDEPENDENT_AMBULATORY_CARE_PROVIDER_SITE_OTHER): Payer: Medicare Other | Admitting: Internal Medicine

## 2013-07-19 ENCOUNTER — Encounter: Payer: Self-pay | Admitting: Internal Medicine

## 2013-07-19 VITALS — BP 171/110 | HR 82 | Temp 97.1°F | Wt 225.0 lb

## 2013-07-19 DIAGNOSIS — Z21 Asymptomatic human immunodeficiency virus [HIV] infection status: Secondary | ICD-10-CM

## 2013-07-19 DIAGNOSIS — I25810 Atherosclerosis of coronary artery bypass graft(s) without angina pectoris: Secondary | ICD-10-CM

## 2013-07-19 DIAGNOSIS — I10 Essential (primary) hypertension: Secondary | ICD-10-CM

## 2013-07-19 DIAGNOSIS — B20 Human immunodeficiency virus [HIV] disease: Secondary | ICD-10-CM

## 2013-07-19 MED ORDER — LISINOPRIL 40 MG PO TABS: 40.0000 mg | ORAL_TABLET | Freq: Every day | ORAL | Status: DC

## 2013-07-19 NOTE — Progress Notes (Signed)
Subjective:    Patient ID: Katie Mccarthy, female    DOB: October 06, 1950, 63 y.o.   MRN: [id number]  HPI 63yo F with well controlled HIV, CD 4 count of 840/VL<20, but has poorly controlled HTN, CAD s/p CABG. At her visit last week, she disclosed to us that she decided to stop taking her bp meds because she feels slurred speech and sleepiness x 7 days. No associated headache or dizziness. When she was seen last week her BP was sbP>200/dBP>100. We instructed her to start back on her lisinopril 20mg  and amlodipine 10mg  that would not likely cause her symptoms. She is back to recheck on her BP and her symptoms. She states that she tolerating her 2 bp meds without difficulty.    Current Outpatient Prescriptions on File Prior to Visit  Medication Sig Dispense Refill  . amLODipine (NORVASC) 10 MG tablet Take 10 mg by mouth daily.      Marland Kitchen. aspirin 81 MG chewable tablet Chew 81 mg by mouth daily. Took 2 tablets in morning, 2 more around lunch time, then EMS told her to take 4 more tablets-only for today 08/20/12 (CP).      . Aspirin-Acetaminophen-Caffeine (EXCEDRIN PO) Take by mouth as needed.      . cloNIDine (CATAPRES) 0.1 MG tablet Take 0.1 mg by mouth 2 (two) times daily.      Marland Kitchen. efavirenz-emtricitabine-tenofovir (ATRIPLA) 600-200-300 MG per tablet Take 1 tablet by mouth at bedtime.      . gabapentin (NEURONTIN) 300 MG capsule TAKE 1 CAPSULE BY MOUTH TWICE DAILY FOR 5 DAYS THEN 1 CAPSULE THREE TIMES A DAY  90 capsule  2  . ibuprofen (ADVIL,MOTRIN) 600 MG tablet Take 1 tablet (600 mg total) by mouth every 6 (six) hours as needed for pain.  30 tablet  0  . isosorbide mononitrate (IMDUR) 60 MG 24 hr tablet Take 60 mg by mouth daily.      Marland Kitchen. lisinopril (PRINIVIL,ZESTRIL) 20 MG tablet Take 20 mg by mouth daily.       . metFORMIN (GLUCOPHAGE) 500 MG tablet Take 500 mg by mouth daily with breakfast.      . metoprolol succinate (TOPROL-XL) 50 MG 24 hr tablet Take 50 mg by mouth daily. Take with or immediately  following a meal.      . prasugrel (EFFIENT) 10 MG TABS tablet Take 10 mg by mouth daily.      . rosuvastatin (CRESTOR) 20 MG tablet Take 20 mg by mouth daily.      Marland Kitchen. tiZANidine (ZANAFLEX) 4 MG tablet Take 4 mg by mouth 3 (three) times daily.       No current facility-administered medications on file prior to visit.       Review of Systems 10 point ROS negative    Objective:   Physical Exam BP 171/110  Pulse 82  Temp(Src) 97.1 F (36.2 C) (Oral)  Wt 225 lb (102.059 kg) Physical Exam  Constitutional:  oriented to person, place, and time.  appears well-developed and well-nourished. No distress.  HENT:  Mouth/Throat: Oropharynx is clear and moist. No oropharyngeal exudate.  Cardiovascular: Normal rate, regular rhythm and normal heart sounds. Exam reveals no gallop and no friction rub.  No murmur heard.  Pulmonary/Chest: Effort normal and breath sounds normal. No respiratory distress. He has no wheezes.       Assessment & Plan:  htn = still not at goal. I believe that the clonidine possibly was causing her side effects. Although she does take quiet  a few medications and could be unrelated to clonidine. She tolerated restarting amlodipine and lisinopril. will increase lisinopril from 20 to 40mg  daily, and start back on metop XL today. i will defer to cardiology to see which addn agent they would like to start to get her to goal  hiv = continue atripla  CAD = she continues on asa, now back on b-blocker and lisinopril. She did temporarily stop crestor since she thought it caused diarrhea. We are seeing her next month to reinitiate or have cardiology restart.  rtc in 4 wk

## 2013-08-17 ENCOUNTER — Other Ambulatory Visit: Payer: Self-pay

## 2013-08-17 DIAGNOSIS — Z1231 Encounter for screening mammogram for malignant neoplasm of breast: Secondary | ICD-10-CM

## 2013-09-02 ENCOUNTER — Ambulatory Visit
Admission: RE | Admit: 2013-09-02 | Discharge: 2013-09-02 | Disposition: A | Discharge status: Home / Self Care | Payer: PRIVATE HEALTH INSURANCE | Source: Ambulatory Visit

## 2013-09-02 ENCOUNTER — Other Ambulatory Visit: Payer: Self-pay | Admitting: Internal Medicine

## 2013-09-02 DIAGNOSIS — Z1231 Encounter for screening mammogram for malignant neoplasm of breast: Secondary | ICD-10-CM

## 2013-10-27 ENCOUNTER — Other Ambulatory Visit: Payer: Self-pay | Admitting: Internal Medicine

## 2013-10-27 DIAGNOSIS — B20 Human immunodeficiency virus [HIV] disease: Secondary | ICD-10-CM

## 2013-12-15 ENCOUNTER — Other Ambulatory Visit: Payer: PRIVATE HEALTH INSURANCE

## 2013-12-15 DIAGNOSIS — Z79899 Other long term (current) drug therapy: Secondary | ICD-10-CM

## 2013-12-15 DIAGNOSIS — B20 Human immunodeficiency virus [HIV] disease: Secondary | ICD-10-CM

## 2013-12-15 DIAGNOSIS — Z113 Encounter for screening for infections with a predominantly sexual mode of transmission: Secondary | ICD-10-CM

## 2013-12-15 LAB — LIPID PANEL
CHOLESTEROL: 193 mg/dL (ref 0–200)
HDL: 58 mg/dL (ref >39)
LDL Cholesterol: 100 mg/dL — ABNORMAL HIGH (ref 0–99)
Total CHOL/HDL Ratio: 3.3 Ratio
Triglycerides: 177 mg/dL — ABNORMAL HIGH (ref <150)
VLDL: 35 mg/dL (ref 0–40)

## 2013-12-15 LAB — CBC WITH DIFFERENTIAL/PLATELET
BASOS PCT: 0 % (ref 0–1)
Basophils Absolute: 0 10*3/uL (ref 0.0–0.1)
EOS ABS: 0.1 10*3/uL (ref 0.0–0.7)
EOS PCT: 3 % (ref 0–5)
HEMATOCRIT: 38.7 % (ref 36.0–46.0)
HEMOGLOBIN: 12.5 g/dL (ref 12.0–15.0)
Lymphocytes Relative: 39 % (ref 12–46)
Lymphs Abs: 1.8 10*3/uL (ref 0.7–4.0)
MCH: 26.3 pg (ref 26.0–34.0)
MCHC: 32.3 g/dL (ref 30.0–36.0)
MCV: 81.5 fL (ref 78.0–100.0)
MONO ABS: 0.2 10*3/uL (ref 0.1–1.0)
MONOS PCT: 5 % (ref 3–12)
Neutro Abs: 2.4 10*3/uL (ref 1.7–7.7)
Neutrophils Relative %: 53 % (ref 43–77)
Platelets: 362 10*3/uL (ref 150–400)
RBC: 4.75 MIL/uL (ref 3.87–5.11)
RDW: 15.7 % — ABNORMAL HIGH (ref 11.5–15.5)
WBC: 4.5 10*3/uL (ref 4.0–10.5)

## 2013-12-15 LAB — COMPREHENSIVE METABOLIC PANEL
ALK PHOS: 60 U/L (ref 39–117)
ALT: 12 U/L (ref 0–35)
AST: 16 U/L (ref 0–37)
Albumin: 4.1 g/dL (ref 3.5–5.2)
BILIRUBIN TOTAL: 0.2 mg/dL (ref 0.2–1.2)
BUN: 18 mg/dL (ref 6–23)
CO2: 27 mEq/L (ref 19–32)
CREATININE: 1.24 mg/dL — AB (ref 0.50–1.10)
Calcium: 10.2 mg/dL (ref 8.4–10.5)
Chloride: 106 mEq/L (ref 96–112)
Glucose, Bld: 112 mg/dL — ABNORMAL HIGH (ref 70–99)
Potassium: 4.9 mEq/L (ref 3.5–5.3)
SODIUM: 142 meq/L (ref 135–145)
Total Protein: 7.5 g/dL (ref 6.0–8.3)

## 2013-12-15 LAB — RPR

## 2013-12-16 LAB — HIV-1 RNA QUANT-NO REFLEX-BLD: HIV-1 RNA Quant, Log: <1.3 {Log} (ref <1.30)

## 2013-12-16 LAB — T-HELPER CELL (CD4) - (RCID CLINIC ONLY)
CD4 % Helper T Cell: 38 % (ref 33–55)
CD4 T Cell Abs: 720 /uL (ref 400–2700)

## 2013-12-29 ENCOUNTER — Ambulatory Visit: Payer: Self-pay | Admitting: Internal Medicine

## 2013-12-30 ENCOUNTER — Ambulatory Visit: Payer: Self-pay | Admitting: Internal Medicine

## 2014-01-06 ENCOUNTER — Ambulatory Visit (INDEPENDENT_AMBULATORY_CARE_PROVIDER_SITE_OTHER): Payer: PRIVATE HEALTH INSURANCE | Admitting: Internal Medicine

## 2014-01-06 ENCOUNTER — Encounter: Payer: Self-pay | Admitting: Internal Medicine

## 2014-01-06 VITALS — BP 152/89 | HR 105 | Temp 98.2°F | Wt 206.0 lb

## 2014-01-06 DIAGNOSIS — Z23 Encounter for immunization: Secondary | ICD-10-CM

## 2014-01-06 DIAGNOSIS — N179 Acute kidney failure, unspecified: Secondary | ICD-10-CM

## 2014-01-06 NOTE — Progress Notes (Addendum)
Patient ID: Katie Mccarthy, female   DOB: 06-29-1950, 63 y.o.   MRN: [id number]       Patient ID: Katie Mccarthy, female   DOB: [birth year], 63 y.o.   MRN: [id number]  HPI  63yo F with HIV, well controlled and HTN, DM, Cd4 count of 720/VL<20 on atripla. She is doing well with adherence. Since we last saw her, she had an episode of gout. She notices having some arthritis to back and foot which she takes ibuprofen  BID on a daily basis for the past month or 2.  Outpatient Encounter Prescriptions as of 01/06/2014  Medication Sig  . amLODipine (NORVASC) 10 MG tablet Take 10 mg by mouth daily.  Marland Kitchen aspirin 81 MG chewable tablet Chew 81 mg by mouth daily. Took 2 tablets in morning, 2 more around lunch time, then EMS told her to take 4 more tablets-only for today 08/20/12 (CP).  . Aspirin-Acetaminophen-Caffeine (EXCEDRIN PO) Take by mouth as needed.  . ATRIPLA 600-200-300 MG per tablet TAKE 1 TABLET BY MOUTH EVERY NIGHT AT BEDTIME  . cloNIDine (CATAPRES) 0.1 MG tablet Take 0.1 mg by mouth 2 (two) times daily.  Marland Kitchen gabapentin (NEURONTIN) 300 MG capsule TAKE 1 CAPSULE BY MOUTH TWICE DAILY FOR 5 DAYS THEN 1 CAPSULE THREE TIMES A DAY  . ibuprofen (ADVIL,MOTRIN) 600 MG tablet Take 1 tablet (600 mg total) by mouth every 6 (six) hours as needed for pain.  . isosorbide mononitrate (IMDUR) 60 MG 24 hr tablet Take 60 mg by mouth daily.  Marland Kitchen lisinopril (PRINIVIL,ZESTRIL) 40 MG tablet Take 1 tablet (40 mg total) by mouth daily.  . metFORMIN (GLUCOPHAGE) 500 MG tablet Take 500 mg by mouth daily with breakfast.  . metoprolol succinate (TOPROL-XL) 50 MG 24 hr tablet Take 50 mg by mouth daily. Take with or immediately following a meal.  . prasugrel (EFFIENT) 10 MG TABS tablet Take 10 mg by mouth daily.  . rosuvastatin (CRESTOR) 20 MG tablet Take 20 mg by mouth daily.  Marland Kitchen tiZANidine (ZANAFLEX) 4 MG tablet Take 4 mg by mouth 3 (three) times daily.     Patient Active Problem List   Diagnosis Date Noted  . HIV (human  immunodeficiency virus infection) 04/27/2012    Priority: High  . Hypertension 04/27/2012    Priority: Medium  . Diabetes 04/27/2012    Priority: Medium  . Hypercholesteremia 04/27/2012    Priority: Medium  . LSIL (low grade squamous intraepithelial lesion) on Pap smear 12/30/2012  . Gout 04/27/2012  . Asthma 04/27/2012  . CAD (coronary artery disease) 04/27/2012  . 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 Hemoglobin A1c  07/11/1950  . Foot Exam  01/25/1962  . Ophthalmology Exam  01/25/1962  . Urine Microalbumin  01/25/1962  . Colonoscopy  01/25/2002  . Zostavax  01/26/2012  . Influenza Vaccine  12/04/2013     Review of Systems 10 point ros is negative Physical Exam   BP 152/89  Pulse 105  Temp(Src) 98.2 F (36.8 C) (Oral)  Wt 206 lb (93.441 kg)  Constitutional:  oriented to person, place, and time. appears well-developed and well-nourished. No distress.  HENT:  Mouth/Throat: Oropharynx is clear and moist. No oropharyngeal exudate.  Cardiovascular: Normal rate, regular rhythm and normal heart sounds. Exam reveals no gallop and no friction rub.  No murmur heard.  Pulmonary/Chest: Effort normal and breath sounds normal. No respiratory distress.  has no wheezes.  Abdominal: Soft. Bowel sounds  are normal.  exhibits no distension. There is no tenderness.  Lymphadenopathy: no cervical adenopathy.  Neurological: alert and oriented to person, place, and time.  Skin: Skin is warm and dry. No rash noted. No erythema.  Psychiatric: a normal mood and affect. His behavior is normal.   Lab Results  Component Value Date   CD4TCELL 38 12/15/2013   Lab Results  Component Value Date   CD4TABS 720 12/15/2013   CD4TABS 840 06/10/2013   CD4TABS 770 12/28/2012   Lab Results  Component Value Date   HIV1RNAQUANT <20 12/15/2013   Lab Results  Component Value Date   HEPBSAB NONREACTIVE 04/16/2012   No results found for this basename: RPR    CBC Lab  Results  Component Value Date   WBC 4.5 12/15/2013   RBC 4.75 12/15/2013   HGB 12.5 12/15/2013   HCT 38.7 12/15/2013   PLT 362 12/15/2013   MCV 81.5 12/15/2013   MCH 26.3 12/15/2013   MCHC 32.3 12/15/2013   RDW 15.7* 12/15/2013   LYMPHSABS 1.8 12/15/2013   MONOABS 0.2 12/15/2013   EOSABS 0.1 12/15/2013   BASOSABS 0.0 12/15/2013   BMET Lab Results  Component Value Date   CREATININE 1.39* 01/06/2014   CREATININE 1.24* 12/15/2013   CREATININE 0.80 06/10/2013     Assessment and Plan  aki = will check bmp, and ua nad micro-albumin. She has had a large increase in cr in the last 6 monts. We will ask her to stop taking nsaids. Will also refer renal if continues to worsen despite changes. We will recheck her Cr next week.  hiv = well controlled. Will check [id number] to change her off of tenofovir  htn = still not at goal, on numerous agents. Will change her off of metoprolol and switch to coreg 12.5mg  BID  Health maintenance = flu vaccine today

## 2014-01-07 LAB — BASIC METABOLIC PANEL WITH GFR
BUN: 20 mg/dL (ref 6–23)
CHLORIDE: 104 meq/L (ref 96–112)
CO2: 23 meq/L (ref 19–32)
Calcium: 9.8 mg/dL (ref 8.4–10.5)
Creat: 1.39 mg/dL — ABNORMAL HIGH (ref 0.50–1.10)
GFR, Est African American: 47 mL/min — ABNORMAL LOW
GFR, Est Non African American: 41 mL/min — ABNORMAL LOW
Glucose, Bld: 90 mg/dL (ref 70–99)
POTASSIUM: 3.7 meq/L (ref 3.5–5.3)
Sodium: 141 mEq/L (ref 135–145)

## 2014-01-07 NOTE — Addendum Note (Signed)
Addended by: Judyann Munson on: 01/07/2014 12:24 PM   Modules accepted: Orders

## 2014-01-11 ENCOUNTER — Telehealth: Payer: Self-pay | Admitting: *Deleted

## 2014-01-11 NOTE — Telephone Encounter (Signed)
Message copied by Lurlean Leyden on Tue Jan 11, 2014 11:21 AM ------      Message from: Judyann Munson      Created: Fri Jan 07, 2014 12:24 PM       Katie Mccarthy is going to come in next Thursday for repeat bmp and urine studies. Can you also record her BP. i will use that recording to decide if need to change bp meds. ------

## 2014-01-11 NOTE — Telephone Encounter (Signed)
Per Dr Drue Second will add the patient to the lab schedule and remind her by phone also.

## 2014-01-13 ENCOUNTER — Other Ambulatory Visit: Payer: PRIVATE HEALTH INSURANCE

## 2014-01-13 DIAGNOSIS — N179 Acute kidney failure, unspecified: Secondary | ICD-10-CM

## 2014-01-13 LAB — BASIC METABOLIC PANEL WITH GFR
BUN: 17 mg/dL (ref 6–23)
CHLORIDE: 106 meq/L (ref 96–112)
CO2: 23 mEq/L (ref 19–32)
Calcium: 9.5 mg/dL (ref 8.4–10.5)
Creat: 1 mg/dL (ref 0.50–1.10)
GFR, EST NON AFRICAN AMERICAN: 61 mL/min
GFR, Est African American: 70 mL/min
GLUCOSE: 120 mg/dL — AB (ref 70–99)
POTASSIUM: 4 meq/L (ref 3.5–5.3)
SODIUM: 139 meq/L (ref 135–145)

## 2014-01-14 LAB — HLA B*5701: HLA-B 5701 W/RFLX HLA-B HIGH: NEGATIVE

## 2014-01-17 ENCOUNTER — Other Ambulatory Visit: Payer: Self-pay | Admitting: *Deleted

## 2014-01-17 DIAGNOSIS — I10 Essential (primary) hypertension: Secondary | ICD-10-CM

## 2014-01-17 MED ORDER — CARVEDILOL 12.5 MG PO TABS: 12.5000 mg | ORAL_TABLET | Freq: Two times a day (BID) | ORAL | Status: DC

## 2014-01-17 NOTE — Telephone Encounter (Signed)
Per Dr Drue Second and the patient was notified and advised to call the office if she has any side effects.

## 2014-01-21 ENCOUNTER — Other Ambulatory Visit: Payer: Self-pay | Admitting: Licensed Clinical Social Worker

## 2014-01-21 DIAGNOSIS — I10 Essential (primary) hypertension: Secondary | ICD-10-CM

## 2014-02-03 ENCOUNTER — Ambulatory Visit: Payer: Self-pay | Admitting: Internal Medicine

## 2014-02-03 ENCOUNTER — Observation Stay (HOSPITAL_COMMUNITY)
Admission: EM | Admit: 2014-02-03 | Discharge: 2014-02-04 | Disposition: A | Discharge status: Home / Self Care | Payer: PRIVATE HEALTH INSURANCE | Attending: Internal Medicine | Admitting: Internal Medicine

## 2014-02-03 ENCOUNTER — Encounter (HOSPITAL_COMMUNITY): Payer: Self-pay | Admitting: Emergency Medicine

## 2014-02-03 ENCOUNTER — Emergency Department (HOSPITAL_COMMUNITY): Payer: PRIVATE HEALTH INSURANCE

## 2014-02-03 DIAGNOSIS — Z7982 Long term (current) use of aspirin: Secondary | ICD-10-CM | POA: Insufficient documentation

## 2014-02-03 DIAGNOSIS — Z21 Asymptomatic human immunodeficiency virus [HIV] infection status: Secondary | ICD-10-CM | POA: Diagnosis not present

## 2014-02-03 DIAGNOSIS — E7800 Pure hypercholesterolemia, unspecified: Secondary | ICD-10-CM

## 2014-02-03 DIAGNOSIS — I2510 Atherosclerotic heart disease of native coronary artery without angina pectoris: Secondary | ICD-10-CM

## 2014-02-03 DIAGNOSIS — E119 Type 2 diabetes mellitus without complications: Secondary | ICD-10-CM | POA: Diagnosis not present

## 2014-02-03 DIAGNOSIS — Z951 Presence of aortocoronary bypass graft: Secondary | ICD-10-CM | POA: Diagnosis not present

## 2014-02-03 DIAGNOSIS — R0789 Other chest pain: Secondary | ICD-10-CM | POA: Diagnosis not present

## 2014-02-03 DIAGNOSIS — B20 Human immunodeficiency virus [HIV] disease: Secondary | ICD-10-CM

## 2014-02-03 DIAGNOSIS — R112 Nausea with vomiting, unspecified: Secondary | ICD-10-CM | POA: Diagnosis not present

## 2014-02-03 DIAGNOSIS — Z87891 Personal history of nicotine dependence: Secondary | ICD-10-CM | POA: Insufficient documentation

## 2014-02-03 DIAGNOSIS — Z79899 Other long term (current) drug therapy: Secondary | ICD-10-CM | POA: Insufficient documentation

## 2014-02-03 DIAGNOSIS — Z8739 Personal history of other diseases of the musculoskeletal system and connective tissue: Secondary | ICD-10-CM | POA: Diagnosis not present

## 2014-02-03 DIAGNOSIS — Z9889 Other specified postprocedural states: Secondary | ICD-10-CM | POA: Diagnosis not present

## 2014-02-03 DIAGNOSIS — I10 Essential (primary) hypertension: Secondary | ICD-10-CM | POA: Diagnosis not present

## 2014-02-03 DIAGNOSIS — I2584 Coronary atherosclerosis due to calcified coronary lesion: Secondary | ICD-10-CM

## 2014-02-03 DIAGNOSIS — R079 Chest pain, unspecified: Secondary | ICD-10-CM | POA: Diagnosis present

## 2014-02-03 DIAGNOSIS — R0602 Shortness of breath: Secondary | ICD-10-CM | POA: Insufficient documentation

## 2014-02-03 LAB — BASIC METABOLIC PANEL
Anion gap: 16 — ABNORMAL HIGH (ref 5–15)
BUN: 19 mg/dL (ref 6–23)
CALCIUM: 9.7 mg/dL (ref 8.4–10.5)
CO2: 22 meq/L (ref 19–32)
CREATININE: 0.92 mg/dL (ref 0.50–1.10)
Chloride: 107 mEq/L (ref 96–112)
GFR calc Af Amer: 76 mL/min — ABNORMAL LOW (ref >90)
GFR, EST NON AFRICAN AMERICAN: 65 mL/min — AB (ref >90)
GLUCOSE: 105 mg/dL — AB (ref 70–99)
Potassium: 4.2 mEq/L (ref 3.7–5.3)
Sodium: 145 mEq/L (ref 137–147)

## 2014-02-03 LAB — GLUCOSE, CAPILLARY: Glucose-Capillary: 112 mg/dL — ABNORMAL HIGH (ref 70–99)

## 2014-02-03 LAB — I-STAT TROPONIN, ED: Troponin i, poc: 0 ng/mL (ref 0.00–0.08)

## 2014-02-03 LAB — CBC
HEMATOCRIT: 39.3 % (ref 36.0–46.0)
Hemoglobin: 12.7 g/dL (ref 12.0–15.0)
MCH: 26.7 pg (ref 26.0–34.0)
MCHC: 32.3 g/dL (ref 30.0–36.0)
MCV: 82.6 fL (ref 78.0–100.0)
Platelets: 317 10*3/uL (ref 150–400)
RBC: 4.76 MIL/uL (ref 3.87–5.11)
RDW: 15 % (ref 11.5–15.5)
WBC: 6 10*3/uL (ref 4.0–10.5)

## 2014-02-03 MED ORDER — ASPIRIN 81 MG PO CHEW
81.0000 mg | CHEWABLE_TABLET | Freq: Every day | ORAL | Status: DC
  Administered 2014-02-04: 81 mg via ORAL
  Filled 2014-02-03: qty 1

## 2014-02-03 MED ORDER — TIZANIDINE HCL 4 MG PO TABS
4.0000 mg | ORAL_TABLET | Freq: Three times a day (TID) | ORAL | Status: DC
  Administered 2014-02-04 (×2): 4 mg via ORAL
  Filled 2014-02-03 (×5): qty 1

## 2014-02-03 MED ORDER — ALBUTEROL SULFATE HFA 108 (90 BASE) MCG/ACT IN AERS: 1.0000 | INHALATION_SPRAY | Freq: Four times a day (QID) | RESPIRATORY_TRACT | Status: DC | PRN

## 2014-02-03 MED ORDER — ISOSORBIDE MONONITRATE ER 60 MG PO TB24
60.0000 mg | ORAL_TABLET | Freq: Every day | ORAL | Status: DC
  Administered 2014-02-03 – 2014-02-04 (×2): 60 mg via ORAL
  Filled 2014-02-03 (×3): qty 1

## 2014-02-03 MED ORDER — GABAPENTIN 300 MG PO CAPS
300.0000 mg | ORAL_CAPSULE | Freq: Two times a day (BID) | ORAL | Status: DC
  Administered 2014-02-03 – 2014-02-04 (×2): 300 mg via ORAL
  Filled 2014-02-03 (×3): qty 1

## 2014-02-03 MED ORDER — ACETAMINOPHEN 500 MG PO TABS
1000.0000 mg | ORAL_TABLET | Freq: Once | ORAL | Status: AC
  Administered 2014-02-03: 1000 mg via ORAL
  Filled 2014-02-03: qty 2

## 2014-02-03 MED ORDER — CLONIDINE HCL 0.1 MG PO TABS
0.1000 mg | ORAL_TABLET | Freq: Two times a day (BID) | ORAL | Status: DC
  Administered 2014-02-03 – 2014-02-04 (×2): 0.1 mg via ORAL
  Filled 2014-02-03 (×3): qty 1

## 2014-02-03 MED ORDER — MORPHINE SULFATE 4 MG/ML IJ SOLN
4.0000 mg | Freq: Once | INTRAMUSCULAR | Status: AC
  Administered 2014-02-03: 4 mg via INTRAVENOUS
  Filled 2014-02-03: qty 1

## 2014-02-03 MED ORDER — ASPIRIN 81 MG PO CHEW
324.0000 mg | CHEWABLE_TABLET | Freq: Once | ORAL | Status: AC
  Administered 2014-02-03: 324 mg via ORAL
  Filled 2014-02-03: qty 4

## 2014-02-03 MED ORDER — CARVEDILOL 12.5 MG PO TABS
12.5000 mg | ORAL_TABLET | Freq: Two times a day (BID) | ORAL | Status: DC
  Administered 2014-02-04 (×2): 12.5 mg via ORAL
  Filled 2014-02-03 (×3): qty 1

## 2014-02-03 MED ORDER — HEPARIN SODIUM (PORCINE) 5000 UNIT/ML IJ SOLN
5000.0000 [IU] | Freq: Three times a day (TID) | INTRAMUSCULAR | Status: DC
  Administered 2014-02-03 – 2014-02-04 (×2): 5000 [IU] via SUBCUTANEOUS
  Filled 2014-02-03 (×4): qty 1

## 2014-02-03 MED ORDER — PRASUGREL HCL 10 MG PO TABS
10.0000 mg | ORAL_TABLET | Freq: Every day | ORAL | Status: DC
  Administered 2014-02-04 (×2): 10 mg via ORAL
  Filled 2014-02-03 (×3): qty 1

## 2014-02-03 MED ORDER — AMLODIPINE BESYLATE 10 MG PO TABS
10.0000 mg | ORAL_TABLET | Freq: Every day | ORAL | Status: DC
  Administered 2014-02-03 – 2014-02-04 (×2): 10 mg via ORAL
  Filled 2014-02-03: qty 1
  Filled 2014-02-03: qty 2

## 2014-02-03 MED ORDER — NITROGLYCERIN 0.4 MG SL SUBL
0.4000 mg | SUBLINGUAL_TABLET | SUBLINGUAL | Status: DC | PRN
  Filled 2014-02-03: qty 1

## 2014-02-03 MED ORDER — ALBUTEROL SULFATE (2.5 MG/3ML) 0.083% IN NEBU: 2.5000 mg | INHALATION_SOLUTION | Freq: Four times a day (QID) | RESPIRATORY_TRACT | Status: DC | PRN

## 2014-02-03 MED ORDER — NITROGLYCERIN 0.4 MG SL SUBL
0.4000 mg | SUBLINGUAL_TABLET | SUBLINGUAL | Status: AC | PRN
  Administered 2014-02-03 (×3): 0.4 mg via SUBLINGUAL

## 2014-02-03 MED ORDER — METFORMIN HCL 500 MG PO TABS
500.0000 mg | ORAL_TABLET | Freq: Every day | ORAL | Status: DC
  Filled 2014-02-03: qty 1

## 2014-02-03 MED ORDER — MORPHINE SULFATE 2 MG/ML IJ SOLN: 2.0000 mg | INTRAMUSCULAR | Status: DC | PRN

## 2014-02-03 MED ORDER — MORPHINE SULFATE 4 MG/ML IJ SOLN
4.0000 mg | Freq: Once | INTRAMUSCULAR | Status: DC
  Filled 2014-02-03: qty 1

## 2014-02-03 MED ORDER — LISINOPRIL 40 MG PO TABS
40.0000 mg | ORAL_TABLET | Freq: Every day | ORAL | Status: DC
  Administered 2014-02-03 – 2014-02-04 (×2): 40 mg via ORAL
  Filled 2014-02-03: qty 2
  Filled 2014-02-03: qty 1

## 2014-02-03 MED ORDER — EFAVIRENZ-EMTRICITAB-TENOFOVIR 600-200-300 MG PO TABS
1.0000 | ORAL_TABLET | Freq: Every day | ORAL | Status: DC
  Administered 2014-02-03: 1 via ORAL
  Filled 2014-02-03 (×3): qty 1

## 2014-02-03 MED ORDER — SODIUM CHLORIDE 0.9 % IV BOLUS (SEPSIS)
1000.0000 mL | Freq: Once | INTRAVENOUS | Status: AC
  Administered 2014-02-03: 1000 mL via INTRAVENOUS

## 2014-02-03 MED ORDER — SENNOSIDES-DOCUSATE SODIUM 8.6-50 MG PO TABS
1.0000 | ORAL_TABLET | Freq: Every day | ORAL | Status: DC
  Administered 2014-02-03: 1 via ORAL
  Filled 2014-02-03 (×2): qty 1

## 2014-02-03 NOTE — ED Provider Notes (Signed)
TIME SEEN: 6:55 PM  CHIEF COMPLAINT: Chest pain  HPI: Patient is a 63 y.o. F with history of coronary artery disease status post CABG in 2013, hypertension, diabetes, HIV on Atripla who presents emergency Department chest pain that started one week ago. It has been intermittent. It is worse with exertion and better with rest. She describes as a burning, tight sensation that feels similar to the pain she had when she had her CABG in [place]. She has associated shortness of breath, nausea and vomiting. No radiation of pain. No diaphoresis or dizziness. She reports her cardiologist is in [place] but she cannot remember their name. Her PCP is Dr. Billee Cashing with [facility].  She denies any fevers, chills, cough, lower extremity swelling or pain.  ROS: See HPI Constitutional: no fever  Eyes: no drainage  ENT: no runny nose   Cardiovascular:  no chest pain  Resp: no SOB  GI: no vomiting GU: no dysuria Integumentary: no rash  Allergy: no hives  Musculoskeletal: no leg swelling  Neurological: no slurred speech ROS otherwise negative  PAST MEDICAL HISTORY/PAST SURGICAL HISTORY:  Past Medical History  Diagnosis Date  . Coronary artery disease   . Hypertension   . Diabetes mellitus without complication   . HIV infection   . Sciatica     MEDICATIONS:  Prior to Admission medications   Medication Sig Start Date End Date Taking? Authorizing Provider  albuterol (PROVENTIL HFA;VENTOLIN HFA) 108 (90 BASE) MCG/ACT inhaler Inhale into the lungs every 6 (six) hours as needed for wheezing or shortness of breath.   Yes Historical Provider, MD  amLODipine (NORVASC) 10 MG tablet Take 10 mg by mouth daily.   Yes Historical Provider, MD  aspirin 81 MG chewable tablet Chew 81 mg by mouth daily.    Yes Historical Provider, MD  Aspirin-Acetaminophen-Caffeine (EXCEDRIN PO) Take 1 tablet by mouth as needed (headache).    Yes Historical Provider, MD  carvedilol (COREG) 12.5 MG tablet Take 1 tablet  (12.5 mg total) by mouth 2 (two) times daily with a meal. 01/17/14  Yes Judyann Munson, MD  cloNIDine (CATAPRES) 0.1 MG tablet Take 0.1 mg by mouth 2 (two) times daily.   Yes Historical Provider, MD  efavirenz-emtricitabine-tenofovir (ATRIPLA) 600-200-300 MG per tablet Take 1 tablet by mouth at bedtime.   Yes Historical Provider, MD  gabapentin (NEURONTIN) 300 MG capsule Take 300 mg by mouth 2 (two) times daily.   Yes Historical Provider, MD  ibuprofen (ADVIL,MOTRIN) 600 MG tablet Take 1 tablet (600 mg total) by mouth every 6 (six) hours as needed for pain. 01/18/13  Yes Brandt Loosen, MD  isosorbide mononitrate (IMDUR) 60 MG 24 hr tablet Take 60 mg by mouth daily.   Yes Historical Provider, MD  lisinopril (PRINIVIL,ZESTRIL) 40 MG tablet Take 1 tablet (40 mg total) by mouth daily. 07/19/13  Yes Judyann Munson, MD  metFORMIN (GLUCOPHAGE) 500 MG tablet Take 500 mg by mouth daily with breakfast.   Yes Historical Provider, MD  prasugrel (EFFIENT) 10 MG TABS tablet Take 10 mg by mouth daily.   Yes Historical Provider, MD  tiZANidine (ZANAFLEX) 4 MG tablet Take 4 mg by mouth 3 (three) times daily.   Yes Historical Provider, MD    ALLERGIES:  No Known Allergies  SOCIAL HISTORY:  History  Substance Use Topics  . Smoking status: Former Smoker    Types: Cigarettes    Quit date: 11/23/2010  . Smokeless tobacco: Never Used  . Alcohol Use: No    FAMILY  HISTORY: Family History  Problem Relation Age of Onset  . Diabetes Mother   . Heart disease Mother   . Diabetes Father   . Coronary artery disease Father   . Diabetes Sister   . Diabetes Brother     EXAM: BP 210/105  Pulse 80  Temp(Src) 98.3 F (36.8 C) (Oral)  Resp 20  Ht 5\' 7"  (1.702 m)  Wt 204 lb (92.534 kg)  BMI 31.94 kg/m2  SpO2 97% CONSTITUTIONAL: Alert and oriented and responds appropriately to questions. Well-appearing; well-nourished, nontoxic, pleasant, in no distress HEAD: Normocephalic EYES: Conjunctivae clear, PERRL ENT:  normal nose; no rhinorrhea; moist mucous membranes; pharynx without lesions noted NECK: Supple, no meningismus, no LAD  CARD: RRR; S1 and S2 appreciated; no murmurs, no clicks, no rubs, no gallops RESP: Normal chest excursion without splinting or tachypnea; breath sounds clear and equal bilaterally; no wheezes, no rhonchi, no rales, chest wall is mildly tender to palpation without crepitus or ecchymosis or deformity ABD/GI: Normal bowel sounds; non-distended; soft, non-tender, no rebound, no guarding BACK:  The back appears normal and is non-tender to palpation, there is no CVA tenderness EXT: Normal ROM in all joints; non-tender to palpation; no edema; normal capillary refill; no cyanosis equal pulses in all of her extremities    SKIN: Normal color for age and race; warm NEURO: Moves all extremities equally PSYCH: The patient's mood and manner are appropriate. Grooming and personal hygiene are appropriate.  MEDICAL DECISION MAKING: Patient here with chest pain. She does have some atypical factors including a burning chest pain and some chest pain that is reproducible palpation but she does describe tightness with shortness of breath, nausea and vomiting that is worse with exertion and better with rest. She states this pain is similar to the pain she had when she needed a CABG in 2013. We'll attempt to get records from [place]. She is also hypertensive and did not take her blood pressure medication today. We'll give nitroglycerin and morphine for pain control and reorder her home blood pressure medications. I do not think she is having a dissection at this time given she has no abdominal pain or back pain, appears very comfortable on exam and has equal pulses in all of her extremities.  Patient's labs are unremarkable. Troponin negative. X-ray clear. Pain is improved with nitroglycerin. Her blood pressure is also improving. We'll discuss with medicine for admission for ACS rule out.  ED  PROGRESS: Patient's blood pressure and chest pain or improvement with nitroglycerin and morphine. We have reordered her home blood pressure medications. Discussed with internal medicine teaching service for admission to telemetry, observation for ACS rule out.    EKG Interpretation  Date/Time:  Thursday February 03 2014 18:00:37 EDT Ventricular Rate:  77 PR Interval:  164 QRS Duration: 78 QT Interval:  384 QTC Calculation: 435 R Axis:   7 Text Interpretation:  Sinus rhythm Probable left atrial enlargement Probable left ventricular hypertrophy Anterior Q waves, possibly due to LVH Abnormal T, consider ischemia, lateral leads No significant change since last tracing Confirmed by Frazer Rainville,  DO, Magalie Almon [phone] on 02/03/2014 6:30:15 PM           Layla MawKristen N Nyimah Shadduck, DO 02/03/14 2010

## 2014-02-03 NOTE — ED Notes (Signed)
Attempted report x1. 

## 2014-02-03 NOTE — ED Notes (Signed)
Per EMS: Pt sent from PCP for eval of left sided constant cp x1 week with nausea and sob, Pt denies any injury, also reports that she was sitting down when pain started. Denies anything that makes pain worse or better. EMS noted unremarkable EKG with some st depression. Pt has hx of open heart surgery in 2013 Pt given 3324 mg of aspirin at PCP. Nad noted upon arrival. axox 4.

## 2014-02-03 NOTE — H&P (Signed)
Date: 02/03/2014               Patient Name:  Katie Mccarthy MRN: [id number]  DOB: December 16, 1950 Age / Sex: 63 y.o., female   PCP: Katie Irani, PA-C              Medical Service: Internal Medicine Teaching Service              Attending Physician: Dr. Cliffton Asters, MD    First Contact: Dr. Leatha Gilding Pager: [phone]  Second Contact: Dr. Andrey Campanile Pager: [phone]            After Hours (After 5p/  First Contact Pager: [phone]  weekends / holidays): Second Contact Pager: [phone]   Chief Complaint:  Chest pain  History of Present Illness: Katie Mccarthy is a 64 year old woman with history of CAD s/p CABG in 2013, HTN, DM2, and HIV presenting with chest pain.  Katie Mccarthy reports chronic left sided chest pain and mild shortness of breath since her CABG surgery in 2013 done in [place].  The pain is intermittent burning and chest tightness that is reproducible with palpation of her chest.  She reports that the pain worsened one week ago.  She says that nothing makes the pain better or worse, but she says she is unable to do any physical exertion due to her chronic back pain.  She has had some nausea and vomiting for the last week as well.  She says she felt nauseous this morning when she woke up and but was unable to throw much up.  She says that prior to her CABG surgery, she had pain under her left arm pit.  She reports having a stent placed prior to her CABG and two placed since that time.  She moved to [place] a year ago and saw a cardiologist in [place] once last year.  She reports having stress testing done at that time that was negative.  She says that she has not taken her blood pressure medication today because "she wanted to relax today."  She denies radiation of the pain, diaphoresis, dizziness, edema, or orthopnea.  In the ER, her blood pressure was elevated to 210/105.  EKG showed no new signs of ischemia and POCT troponin was negative.  Chest x-ray showed no signs of acute disease.   She was given aspirin 324 mg, nitroglycerin SL x3, morphine 4 mg IV, and her home dose of lisinopril 40 mg and clonidine 0.1 mg.  Review of Systems: Review of Systems  Constitutional: Positive for malaise/fatigue. Negative for fever, chills, weight loss and diaphoresis.  HENT: Negative for congestion.   Eyes: Negative for blurred vision.  Respiratory: Positive for shortness of breath. Negative for cough, sputum production and wheezing.   Cardiovascular: Positive for chest pain. Negative for palpitations, orthopnea, claudication and leg swelling.  Gastrointestinal: Positive for nausea, vomiting and constipation (Chronic, last bowel movement on Monday.). Negative for heartburn, abdominal pain and diarrhea.  Genitourinary: Negative for dysuria, urgency, frequency, hematuria and flank pain.       Difficulty urinating.  Musculoskeletal: Positive for back pain, joint pain and myalgias.  Skin: Negative for rash.  Neurological: Negative for dizziness, sensory change, focal weakness, weakness and headaches.    Meds: Medications Prior to Admission  Medication Sig Dispense Refill  . albuterol (PROVENTIL HFA;VENTOLIN HFA) 108 (90 BASE) MCG/ACT inhaler Inhale into the lungs every 6 (six) hours as needed for wheezing or shortness of breath.      Marland Kitchen  amLODipine (NORVASC) 10 MG tablet Take 10 mg by mouth daily.      Marland Kitchen aspirin 81 MG chewable tablet Chew 81 mg by mouth daily.       . Aspirin-Acetaminophen-Caffeine (EXCEDRIN PO) Take 1 tablet by mouth as needed (headache).       . carvedilol (COREG) 12.5 MG tablet Take 1 tablet (12.5 mg total) by mouth 2 (two) times daily with a meal.  60 tablet  2  . cloNIDine (CATAPRES) 0.1 MG tablet Take 0.1 mg by mouth 2 (two) times daily.      Marland Kitchen efavirenz-emtricitabine-tenofovir (ATRIPLA) 600-200-300 MG per tablet Take 1 tablet by mouth at bedtime.      . gabapentin (NEURONTIN) 300 MG capsule Take 300 mg by mouth 2 (two) times daily.      Marland Kitchen ibuprofen (ADVIL,MOTRIN) 600  MG tablet Take 1 tablet (600 mg total) by mouth every 6 (six) hours as needed for pain.  30 tablet  0  . isosorbide mononitrate (IMDUR) 60 MG 24 hr tablet Take 60 mg by mouth daily.      Marland Kitchen lisinopril (PRINIVIL,ZESTRIL) 40 MG tablet Take 1 tablet (40 mg total) by mouth daily.  30 tablet  11  . metFORMIN (GLUCOPHAGE) 500 MG tablet Take 500 mg by mouth daily with breakfast.      . prasugrel (EFFIENT) 10 MG TABS tablet Take 10 mg by mouth daily.      Marland Kitchen tiZANidine (ZANAFLEX) 4 MG tablet Take 4 mg by mouth 3 (three) times daily.       Current Facility-Administered Medications  Medication Dose Route Frequency Provider Last Rate Last Dose  . albuterol (PROVENTIL HFA;VENTOLIN HFA) 108 (90 BASE) MCG/ACT inhaler 1-2 puff  1-2 puff Inhalation Q6H PRN Kristen N Ward, DO      . amLODipine (NORVASC) tablet 10 mg  10 mg Oral Daily Kristen N Ward, DO   10 mg at 02/03/14 1949  . aspirin chewable tablet 81 mg  81 mg Oral Daily Kristen N Ward, DO      . [START ON 02/04/2014] carvedilol (COREG) tablet 12.5 mg  12.5 mg Oral BID WC Kristen N Ward, DO      . cloNIDine (CATAPRES) tablet 0.1 mg  0.1 mg Oral BID Kristen N Ward, DO   0.1 mg at 02/03/14 1948  . efavirenz-emtricitabine-tenofovir (ATRIPLA) 600-200-300 MG per tablet 1 tablet  1 tablet Oral QHS Kristen N Ward, DO      . gabapentin (NEURONTIN) capsule 300 mg  300 mg Oral BID Kristen N Ward, DO      . isosorbide mononitrate (IMDUR) 24 hr tablet 60 mg  60 mg Oral Daily Kristen N Ward, DO      . lisinopril (PRINIVIL,ZESTRIL) tablet 40 mg  40 mg Oral Daily Kristen N Ward, DO   40 mg at 02/03/14 1954  . [START ON 02/04/2014] metFORMIN (GLUCOPHAGE) tablet 500 mg  500 mg Oral Q breakfast Kristen N Ward, DO      . morphine 4 MG/ML injection 4 mg  4 mg Intravenous Once Kristen N Ward, DO      . prasugrel (EFFIENT) tablet 10 mg  10 mg Oral Daily Kristen N Ward, DO        Allergies: Allergies as of 02/03/2014  . (No Known Allergies)   Past Medical History  Diagnosis  Date  . Coronary artery disease   . Hypertension   . Diabetes mellitus without complication   . HIV infection   . Sciatica  Past Surgical History  Procedure Laterality Date  . Cardiac catheterization    . Coronary artery bypass graft     Family History  Problem Relation Age of Onset  . Diabetes Mother   . Heart disease Mother   . Diabetes Father   . Coronary artery disease Father   . Diabetes Sister   . Diabetes Brother    History   Social History  . Marital Status: Married    Spouse Name: N/A    Number of Children: N/A  . Years of Education: N/A   Occupational History  . Not on file.   Social History Main Topics  . Smoking status: Former Smoker    Types: Cigarettes    Quit date: 11/23/2010  . Smokeless tobacco: Never Used  . Alcohol Use: No  . Drug Use: No  . Sexual Activity: Yes    Partners: Male    Birth Control/ Protection: Condom   Other Topics Concern  . Not on file   Social History Narrative  . No narrative on file    Physical Exam: Filed Vitals:   02/03/14 2133  BP: 167/104  Pulse: 87  Temp: 97.8 F (36.6 C)  Resp: 18   Physical Exam  Constitutional: She is oriented to person, place, and time and well-developed, well-nourished, and in no distress. No distress.  HENT:  Head: Normocephalic and atraumatic.  Eyes: Conjunctivae and EOM are normal. Pupils are equal, round, and reactive to light. No scleral icterus.  Neck: Normal range of motion. Neck supple. No thyromegaly present.  Cardiovascular: Normal rate and regular rhythm.   Loud S2, denies having a mechanical valve.  Pulmonary/Chest: Effort normal and breath sounds normal. No respiratory distress. She has no wheezes. She has no rales. She exhibits tenderness (Exquisite tenderness with palpation of left chest wall.).  Abdominal: Soft. Bowel sounds are normal. She exhibits no distension. There is no tenderness.  Musculoskeletal: Normal range of motion. She exhibits no edema and no  tenderness.  Large tender bunyon on left foot.  Neurological: She is alert and oriented to person, place, and time. No cranial nerve deficit. She exhibits normal muscle tone.  Skin: Skin is warm and dry. She is not diaphoretic.    Lab results: Basic Metabolic Panel:  Recent Labs  16/02/9609/01/15 1807  NA 145  K 4.2  CL 107  CO2 22  GLUCOSE 105*  BUN 19  CREATININE 0.92  CALCIUM 9.7   CBC:  Recent Labs  02/03/14 1807  WBC 6.0  HGB 12.7  HCT 39.3  MCV 82.6  PLT 317   Troponin (Point of Care Test)  Recent Labs  02/03/14 1854  TROPIPOC 0.00    Imaging results:  Dg Chest Port 1 View  02/03/2014   CLINICAL DATA:  63 year old female with left-sided chest pain  EXAM: PORTABLE CHEST - 1 VIEW  COMPARISON:  CT 08/20/2012, chest x-ray 08/20/2012  FINDINGS: Cardiomediastinal silhouette unchanged in size and contour. Surgical changes of median sternotomy and prior CABG.  No confluent airspace disease. No pneumothorax. No pleural effusion.  No displaced fracture.  IMPRESSION: No radiographic evidence of acute cardiopulmonary disease.  Changes of prior median sternotomy and CABG  Signed,  Yvone NeuJaime S. Loreta AveWagner, DO  [facility]  [facility]   Electronically Signed   By: Gilmer MorJaime  Wagner O.D.   On: 02/03/2014 18:34    Other results: EKG: normal sinus rhythm, Q waves in V1, T wave inversion in aVL.  Assessment & Plan by  Problem: Active Problems:   Chest pain   #Chest pain, nausea, vomiting History of CAD and CABG in past with 3 stents as well, 2 after CABG reportedly.  Burning pain atypical for ACS and worse with palpation.  Improved with nitro, suggesting ACS possible.  She has several risk factors for ACS with her diabetes, obesity, and smoking history.  Pain along with nausea and vomiting could be related to hypertensive urgency. -Admit to telemetry. -Trend troponins. -Repeat EKG in the morning. -Continue home aspirin 81 mg daily. -Continue  home prasugrel 10 mg daily. -Morphine 2 mg q2h PRN -Consult cardiology for possible stress testing. -Carb modified diet.  #Hypertensive urgency Blood pressure elevated to 210s/100s in the ER, but had not received home BP meds today.  Says BP runs high.  Unclear if chest pain is related to the elevated blood pressure.  No other evidence of end organ damage. -Continue home amlodipine 10 mg daily, Coreg 12.5 mg BID, clonidine 0.1 mg BID, Imdur 60 mg daily, lisinopril 40 mg daily. -Urinalysis.  #DM2 Not on insulin.  A1C 6.3 in Care Everywhere. CKD stage II, Cr stable at 0.92. -Hold home metformin 500 mg daily. -SSI sensitive. -Check A1C.  #HIV Follows with Dr. Drue Second of [place].  Last CD4 720, VL <20 12/15/13 on Atripla. -Continue home Atripla 600-200-300 mg QHS.  #Chronic back pain Reports having a slipped disk in her spine. -Continue home gapapentin 300 mg BID. -Continue home tizanidine 4 mg TID.  #?COPD On albuterol inhaler at home and smoking history.  Says she was started on albuterol after her heart surgery. -Continue home albuterol inhaler q6h PRN.  #Constipation Chronic, takes prune juice at home. -Senokot-S QHS.  #DVT prophylaxis -Heparin.  Dispo: Disposition is deferred at this time, awaiting improvement of current medical problems. Anticipated discharge in approximately 1-2 day(s).   The patient does have a current PCP Katie Irani, PA-C), therefore will not be require OPC follow-up after discharge.   The patient does not have transportation limitations that hinder transportation to clinic appointments.   Signed:  Luisa Dago, MD, PhD PGY-1 Internal Medicine Teaching Service Pager: [phone] 02/03/2014, 9:37 PM

## 2014-02-04 ENCOUNTER — Encounter (HOSPITAL_COMMUNITY): Payer: Self-pay | Admitting: General Practice

## 2014-02-04 DIAGNOSIS — I2584 Coronary atherosclerosis due to calcified coronary lesion: Secondary | ICD-10-CM

## 2014-02-04 DIAGNOSIS — R079 Chest pain, unspecified: Secondary | ICD-10-CM

## 2014-02-04 DIAGNOSIS — R0789 Other chest pain: Secondary | ICD-10-CM

## 2014-02-04 DIAGNOSIS — G8929 Other chronic pain: Secondary | ICD-10-CM

## 2014-02-04 DIAGNOSIS — Z79899 Other long term (current) drug therapy: Secondary | ICD-10-CM | POA: Diagnosis not present

## 2014-02-04 DIAGNOSIS — I10 Essential (primary) hypertension: Secondary | ICD-10-CM

## 2014-02-04 DIAGNOSIS — Z951 Presence of aortocoronary bypass graft: Secondary | ICD-10-CM

## 2014-02-04 DIAGNOSIS — E780 Pure hypercholesterolemia: Secondary | ICD-10-CM

## 2014-02-04 DIAGNOSIS — I2510 Atherosclerotic heart disease of native coronary artery without angina pectoris: Secondary | ICD-10-CM

## 2014-02-04 DIAGNOSIS — B20 Human immunodeficiency virus [HIV] disease: Secondary | ICD-10-CM

## 2014-02-04 DIAGNOSIS — Z7982 Long term (current) use of aspirin: Secondary | ICD-10-CM | POA: Diagnosis not present

## 2014-02-04 LAB — URINALYSIS, ROUTINE W REFLEX MICROSCOPIC
Bilirubin Urine: NEGATIVE
Glucose, UA: NEGATIVE mg/dL
Hgb urine dipstick: NEGATIVE
Ketones, ur: NEGATIVE mg/dL
LEUKOCYTES UA: NEGATIVE
NITRITE: NEGATIVE
PROTEIN: NEGATIVE mg/dL
SPECIFIC GRAVITY, URINE: 1.013 (ref 1.005–1.030)
UROBILINOGEN UA: 0.2 mg/dL (ref 0.0–1.0)
pH: 7 (ref 5.0–8.0)

## 2014-02-04 LAB — GLUCOSE, CAPILLARY
GLUCOSE-CAPILLARY: 115 mg/dL — AB (ref 70–99)
Glucose-Capillary: 152 mg/dL — ABNORMAL HIGH (ref 70–99)

## 2014-02-04 LAB — HEMOGLOBIN A1C
HEMOGLOBIN A1C: 6.3 % — AB (ref <5.7)
Mean Plasma Glucose: 134 mg/dL — ABNORMAL HIGH (ref <117)

## 2014-02-04 LAB — TROPONIN I
Troponin I: <0.3 ng/mL (ref <0.30)
Troponin I: <0.3 ng/mL (ref <0.30)

## 2014-02-04 MED ORDER — ABACAVIR SULFATE-LAMIVUDINE 600-300 MG PO TABS
1.0000 | ORAL_TABLET | Freq: Every day | ORAL | Status: DC
  Filled 2014-02-04: qty 1

## 2014-02-04 MED ORDER — ABACAVIR-DOLUTEGRAVIR-LAMIVUD 600-50-300 MG PO TABS: 1.0000 | ORAL_TABLET | Freq: Every day | ORAL | Status: DC

## 2014-02-04 MED ORDER — LAMIVUDINE 150 MG PO TABS
300.0000 mg | ORAL_TABLET | Freq: Every day | ORAL | Status: DC
  Filled 2014-02-04 (×2): qty 2

## 2014-02-04 MED ORDER — DOLUTEGRAVIR SODIUM 50 MG PO TABS
50.0000 mg | ORAL_TABLET | Freq: Every day | ORAL | Status: DC
  Filled 2014-02-04: qty 1

## 2014-02-04 MED ORDER — INSULIN ASPART 100 UNIT/ML ~~LOC~~ SOLN
0.0000 [IU] | Freq: Three times a day (TID) | SUBCUTANEOUS | Status: DC
  Administered 2014-02-04: 2 [IU] via SUBCUTANEOUS

## 2014-02-04 MED ORDER — PANTOPRAZOLE SODIUM 40 MG PO TBEC
40.0000 mg | DELAYED_RELEASE_TABLET | Freq: Every day | ORAL | Status: DC
  Administered 2014-02-04: 40 mg via ORAL
  Filled 2014-02-04: qty 1

## 2014-02-04 MED ORDER — PANTOPRAZOLE SODIUM 40 MG PO TBEC: 40.0000 mg | DELAYED_RELEASE_TABLET | Freq: Every day | ORAL | Status: AC

## 2014-02-04 MED ORDER — ABACAVIR SULFATE 300 MG PO TABS
600.0000 mg | ORAL_TABLET | Freq: Every day | ORAL | Status: DC
  Filled 2014-02-04 (×2): qty 2

## 2014-02-04 NOTE — Consult Note (Signed)
CARDIOLOGY CONSULT NOTE   Patient ID: Katie Mccarthy MRN: [id number], DOB/AGE: [birth year]   Admit date: 02/03/2014 Date of Consult: 02/04/2014   Primary Physician: Teena Irani, PA-C Primary Cardiologist: Charlton Haws, MD  Pt. Profile  2F with well controlled HIV, DM, HTN, CAD s/p 4v CABG (LIMA to LAD, SVG to OM, SVG to RCA, SVG to D) with known occluded OM graft with BMS to the RCA and D grafts in 2013 who presents with atypical chest pain.   Problem List  Past Medical History  Diagnosis Date  . Coronary artery disease   . Hypertension   . Diabetes mellitus without complication   . HIV infection   . Sciatica     Past Surgical History  Procedure Laterality Date  . Cardiac catheterization    . Coronary artery bypass graft       Allergies  No Known Allergies  HPI   2F with well controlled HIV, DM, HTN, CAD s/p 4v CABG (LIMA to LAD, SVG to OM, SVG to RCA, SVG to D) with known occluded OM graft with BMS to the RCA and D grafts in 2013 who presents with atypical chest pain.   The patient reports that today she was at her PCP's office to get med refills. Per routine, she was asked if she had any pain. She reported that she does have chest pain (she has had chronic CP since her CABG). Her BP was checked and was 205/105 in the setting of having missed her HTN meds yesterday. Because of those two things she was referred to the ER where she was admitted.  She reported that the dose of SL NTG resolved her pain.   She tells me that "my chest has been hurting" for years and that the severity of her left sided, non-radiating, non-exertional CP is actually unchanged. The difference is that she noticed some associated nausea for the past week. The chest pain is worse with palpation.   Of note, she had a Lexiscan on Jun 23 2012 that demonstrated mild reperfusion involving portions of lateral wall worrisome or ischemia with normal left ventricular ejection fraction. Her most recent stress  stest was 07/29/13 and it demonstrated normal perfusion and an EF of 69%. She reports that her symptoms prior to receiving stents in 2013 were different: pain under the left arm in the axillary region.   Inpatient Medications  . amLODipine  10 mg Oral Daily  . aspirin  81 mg Oral Daily  . carvedilol  12.5 mg Oral BID WC  . cloNIDine  0.1 mg Oral BID  . efavirenz-emtricitabine-tenofovir  1 tablet Oral QHS  . gabapentin  300 mg Oral BID  . heparin  5,000 Units Subcutaneous 3 times per day  . insulin aspart  0-9 Units Subcutaneous TID WC  . isosorbide mononitrate  60 mg Oral Daily  . lisinopril  40 mg Oral Daily  .  morphine injection  4 mg Intravenous Once  . prasugrel  10 mg Oral Daily  . senna-docusate  1 tablet Oral QHS  . tiZANidine  4 mg Oral TID    Family History Family History  Problem Relation Age of Onset  . Diabetes Mother   . Heart disease Mother   . Diabetes Father   . Coronary artery disease Father   . Diabetes Sister   . Diabetes Brother      Social History History   Social History  . Marital Status: Married    Spouse Name: N/A  Number of Children: N/A  . Years of Education: N/A   Occupational History  . Not on file.   Social History Main Topics  . Smoking status: Former Smoker    Types: Cigarettes    Quit date: 11/23/2010  . Smokeless tobacco: Never Used  . Alcohol Use: No  . Drug Use: No  . Sexual Activity: Yes    Partners: Male    Birth Control/ Protection: Condom   Other Topics Concern  . Not on file   Social History Narrative  . No narrative on file     Review of Systems  General:  No chills, fever, night sweats or weight changes.  Cardiovascular: See HPI Dermatological: No rash, lesions/masses Respiratory: No cough, dyspnea Urologic: No hematuria, dysuria Abdominal:   No nausea, vomiting, diarrhea, bright red blood per rectum, melena, or hematemesis Neurologic:  No visual changes, wkns, changes in mental status. All other  systems reviewed and are otherwise negative except as noted above.  Physical Exam  Blood pressure 138/87, pulse 78, temperature 98.2 F (36.8 C), temperature source Oral, resp. rate 18, height 5\' 7"  (1.702 m), weight 92.534 kg (204 lb), SpO2 97.00%.  General: Pleasant, NAD Psych: Normal affect. Neuro: Alert and oriented X 3. Moves all extremities spontaneously. HEENT: Normal  Neck: Supple without bruits or JVD. Lungs:  Resp regular and unlabored, CTA. Heart: RRR no s3, s4. Soft systolic murmur at LSB. TTP along sternotomy scare which has some minimal keloid formation Abdomen: Soft, non-tender, non-distended, BS + x 4.  Extremities: No clubbing, cyanosis or edema. DP/PT/Radials 2+ and equal bilaterally.  Labs   Recent Labs  02/03/14 2332  TROPONINI <0.30   Lab Results  Component Value Date   WBC 6.0 02/03/2014   HGB 12.7 02/03/2014   HCT 39.3 02/03/2014   MCV 82.6 02/03/2014   PLT 317 02/03/2014    Recent Labs Lab 02/03/14 1807  NA 145  K 4.2  CL 107  CO2 22  BUN 19  CREATININE 0.92  CALCIUM 9.7  GLUCOSE 105*   Lab Results  Component Value Date   CHOL 193 12/15/2013   HDL 58 12/15/2013   LDLCALC 100* 12/15/2013   TRIG 177* 12/15/2013   Lab Results  Component Value Date   DDIMER 0.49* 08/20/2012    Radiology/Studies  Dg Chest Port 1 View  02/03/2014   CLINICAL DATA:  63 year old female with left-sided chest pain  EXAM: PORTABLE CHEST - 1 VIEW  COMPARISON:  CT 08/20/2012, chest x-ray 08/20/2012  FINDINGS: Cardiomediastinal silhouette unchanged in size and contour. Surgical changes of median sternotomy and prior CABG.  No confluent airspace disease. No pneumothorax. No pleural effusion.  No displaced fracture.  IMPRESSION: No radiographic evidence of acute cardiopulmonary disease.  Changes of prior median sternotomy and CABG  Signed,  Yvone NeuJaime S. Loreta AveWagner, DO  [facility]  [facility]   Electronically Signed   By: Gilmer MorJaime  Wagner  O.D.   On: 02/03/2014 18:34    ECG  02/03/14 NSR, TWI aVL, TW flattening in V5-V6. 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  08/22/12 NSR, TWI aVL  ASSESSMENT AND PLAN  47F with well controlled HIV, DM, HTN, CAD s/p 4v CABG (LIMA to LAD, SVG to OM, SVG to RCA, SVG to D) with known occluded OM graft with BMS to the RCA and D grafts in 2013 who presents with atypical chest pain. Although the chest pain is "nitro responsive," it is very clearly reproducible and could be chronic neuropathic pain from her  sternotomy incision.   1. Continue current medical regimen: aspirin, prasugrel, metoprolol, lisinopril 2. She should be on a statin. Pravastatin is typically does not have many interactions with antiretrovirals. Please check with pharmacy in AM.  3. Consider increase dose of neurontin for sternotomy related pain 4. No functional testing so long as enzymes remain negative  Signed, Glori Luis, MD 02/04/2014, 2:33 AM

## 2014-02-04 NOTE — Progress Notes (Signed)
SUBJECTIVE:  No further CP  OBJECTIVE:   Vitals:   Filed Vitals:   02/03/14 2133 02/04/14 0022 02/04/14 0500 02/04/14 0752  BP: 167/104 138/87 116/78 123/89  Pulse: 87 78 88 99  Temp: 97.8 F (36.6 C) 98.2 F (36.8 C) 97.6 F (36.4 C)   TempSrc: Oral Oral Oral   Resp: 18 18 18 16   Height: 5\' 7"  (1.702 m)     Weight:   197 lb 4.8 oz (89.495 kg)   SpO2: 99% 97%  100%   I&O's:   Intake/Output Summary (Last 24 hours) at 02/04/14 [id number] Last data filed at 02/04/14 0753  Gross per 24 hour  Intake    240 ml  Output      1 ml  Net    239 ml   TELEMETRY: Reviewed telemetry pt in NSR:     PHYSICAL EXAM General: Well developed, well nourished, in no acute distress Head: Eyes PERRLA, No xanthomas.   Normal cephalic and atramatic  Lungs:   Clear bilaterally to auscultation and percussion. Heart:   HRRR S1 S2 Pulses are 2+ & equal Msk:  Back normal, normal gait. Normal strength and tone for age. Extremities:   No clubbing, cyanosis or edema.  DP +1 Neuro: Alert and oriented X 3. Psych:  Good affect, responds appropriately   LABS: Basic Metabolic Panel:  Recent Labs  96/08/5408/01/15 1807  NA 145  K 4.2  CL 107  CO2 22  GLUCOSE 105*  BUN 19  CREATININE 0.92  CALCIUM 9.7   Liver Function Tests: No results found for this basename: AST, ALT, ALKPHOS, BILITOT, PROT, ALBUMIN,  in the last 72 hours No results found for this basename: LIPASE, AMYLASE,  in the last 72 hours CBC:  Recent Labs  02/03/14 1807  WBC 6.0  HGB 12.7  HCT 39.3  MCV 82.6  PLT 317   Cardiac Enzymes:  Recent Labs  02/03/14 2332 02/04/14 0146 02/04/14 0502  TROPONINI <0.30 <0.30 <0.30   BNP: No components found with this basename: POCBNP,  D-Dimer: No results found for this basename: DDIMER,  in the last 72 hours Hemoglobin A1C: No results found for this basename: HGBA1C,  in the last 72 hours Fasting Lipid Panel: No results found for this basename: CHOL, HDL, LDLCALC, TRIG, CHOLHDL,  LDLDIRECT,  in the last 72 hours Thyroid Function Tests: No results found for this basename: TSH, T4TOTAL, FREET3, T3FREE, THYROIDAB,  in the last 72 hours Anemia Panel: No results found for this basename: VITAMINB12, FOLATE, FERRITIN, TIBC, IRON, RETICCTPCT,  in the last 72 hours Coag Panel:   No results found for this basename: INR, PROTIME    RADIOLOGY: Dg Chest Port 1 View  02/03/2014   CLINICAL DATA:  63 year old female with left-sided chest pain  EXAM: PORTABLE CHEST - 1 VIEW  COMPARISON:  CT 08/20/2012, chest x-ray 08/20/2012  FINDINGS: Cardiomediastinal silhouette unchanged in size and contour. Surgical changes of median sternotomy and prior CABG.  No confluent airspace disease. No pneumothorax. No pleural effusion.  No displaced fracture.  IMPRESSION: No radiographic evidence of acute cardiopulmonary disease.  Changes of prior median sternotomy and CABG  Signed,  Yvone NeuJaime S. Loreta AveWagner, DO  [facility]  [facility]   Electronically Signed   By: Gilmer MorJaime  Wagner O.D.   On: 02/03/2014 18:34   ASSESSMENT AND PLAN  19F with well controlled HIV, DM, HTN, CAD s/p 4v CABG (LIMA to LAD, SVG to OM, SVG to RCA, SVG to  D) with known occluded OM graft with BMS to the RCA and D grafts in 2013 who presents with atypical chest pain. Although the chest pain is "nitro responsive," it is very clearly reproducible and could be chronic neuropathic pain from her sternotomy incision. EKG unchanged from EKG 2014.  CP has been present since her CABG and has not changed any.  The main thing that brought her in to the ER was the nausea she had this week and her BP was elevated due to missing her BP meds.  She had a lexiscan myoview 3/15 that showed normal perfusion and normal LVF 1. Continue current medical regimen: aspirin, prasugrel, metoprolol, lisinopril  2. She should be on a statin. Pravastatin is typically does not have many interactions with antiretrovirals. Please check  with pharmacy in AM.  3. Consider increase dose of neurontin for sternotomy related pain  4. No further functional testing since cardiac enzymes are normal  Will sign off   Quintella Reichert, MD  02/04/2014  9:07 AM

## 2014-02-04 NOTE — Discharge Instructions (Signed)
Your HIV medication was switched from Atripla to Triumeq. This was switched in order to prevent problems with your kidneys (which can happen on occasion to patients who take Atripla). Dr. Drue SecondSnider, your infectious disease doctor is aware of this change.  Please start taking the Protonix that was prescribed; this will help your chest pain if it is due to esophageal reflux. You took it today in the hospital.  If you develop worsening chest pain, feel short of breath, or develop other symptoms, you can return to the emergency department.   Please attend your appointment with Brandon MelnickSara Carter on 10/19 for further follow-up.

## 2014-02-04 NOTE — Progress Notes (Signed)
Subjective: Katie Mccarthy feels better this morning. She is not in chest pain. She does have a headache. When asked about how her pain yesterday compared to her baseline, she says that it was similar to her baseline pain. Of note, when specifically asked about stressors, Katie Mccarthy admits that her 62 yo son has been causing her heightened anxiety lately; she is in the process of forcing him out of the house (he turns 18 next month).  Discussion with her PCP Katie Mccarthy) RN this morning: patient's BP was elevated to 195/113 and she was "dragging", less energetic than when they usually see her. Unfortunately, their EKG was not working, so they activated EMS.  Katie Mccarthy had last been into the office in 04/2013. This office refills her BP medications (amlodipine, lisinopril, clonidine and carvedilol). They have referred her to a cardiologist, but the patient has never been.  Objective: Vital signs in last 24 hours: Filed Vitals:   02/04/14 0022 02/04/14 0500 02/04/14 0752 02/04/14 1148  BP: 138/87 116/78 123/89 88/58  Pulse: 78 88 99 74  Temp: 98.2 F (36.8 C) 97.6 F (36.4 C)  98 F (36.7 C)  TempSrc: Oral Oral  Oral  Resp: 18 18 16 16   Height:      Weight:  197 lb 4.8 oz (89.495 kg)    SpO2: 97%  100% 100%   Weight change:   Intake/Output Summary (Last 24 hours) at 02/04/14 1150 Last data filed at 02/04/14 0900  Gross per 24 hour  Intake    500 ml  Output      1 ml  Net    499 ml   Physical Exam: Appearance: patient' appears comfortable, lying in bed HEENT: /AT, PERRL, EOMi, no elevated JVD Heart: RRR, normal S1, loud S2, no MRG, exquisitely tender to palpation near her 4" median sternotomy scar Lungs: CTAB Abdomen: BS+, soft, nontender, no organomegaly Musculoskeletal: no edema or tenderness Neurologic: A&Ox3, otherwise grossly intact Skin: warm and dry, no diaphoresis  Lab Results: Basic Metabolic Panel:  Recent Labs Lab 02/03/14 1807  NA 145  K 4.2  CL 107  CO2  22  GLUCOSE 105*  BUN 19  CREATININE 0.92  CALCIUM 9.7   CBC:  Recent Labs Lab 02/03/14 1807  WBC 6.0  HGB 12.7  HCT 39.3  MCV 82.6  PLT 317   Cardiac Enzymes:  Recent Labs Lab 02/03/14 2332 02/04/14 0146 02/04/14 0502  TROPONINI <0.30 <0.30 <0.30   CBG:  Recent Labs Lab 02/03/14 2148 02/04/14 0754  GLUCAP 112* 115*   Urinalysis:  Recent Labs Lab 02/03/14 2333  COLORURINE YELLOW  LABSPEC 1.013  PHURINE 7.0  GLUCOSEU NEGATIVE  HGBUR NEGATIVE  BILIRUBINUR NEGATIVE  KETONESUR NEGATIVE  PROTEINUR NEGATIVE  UROBILINOGEN 0.2  NITRITE NEGATIVE  LEUKOCYTESUR NEGATIVE   Micro Results: No results found for this or any previous visit (from the past 240 hour(s)). Studies/Results: Dg Chest Port 1 View  02/03/2014   CLINICAL DATA:  63 year old female with left-sided chest pain  EXAM: PORTABLE CHEST - 1 VIEW  COMPARISON:  CT 08/20/2012, chest x-ray 08/20/2012  FINDINGS: Cardiomediastinal silhouette unchanged in size and contour. Surgical changes of median sternotomy and prior CABG.  No confluent airspace disease. No pneumothorax. No pleural effusion.  No displaced fracture.  IMPRESSION: No radiographic evidence of acute cardiopulmonary disease.  Changes of prior median sternotomy and CABG  Signed,  Katie Fanny. Earleen Newport, DO  [facility]  [facility]   Electronically Signed  By: Katie Mckusick O.D.   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 02/03/14 NSR, TWI aVL, TW flattening in V5-V6. 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  08/22/12 NSR, TWI aVL  Telemetry: NSR, no events  Medications: I have reviewed the patient's current medications. Scheduled Meds: . amLODipine  10 mg Oral Daily  . aspirin  81 mg Oral Daily  . carvedilol  12.5 mg Oral BID WC  . cloNIDine  0.1 mg Oral BID  . efavirenz-emtricitabine-tenofovir  1 tablet Oral QHS  . gabapentin  300 mg Oral BID    . heparin  5,000 Units Subcutaneous 3 times per day  . insulin aspart  0-9 Units Subcutaneous TID WC  . isosorbide mononitrate  60 mg Oral Daily  . lisinopril  40 mg Oral Daily  .  morphine injection  4 mg Intravenous Once  . prasugrel  10 mg Oral Daily  . senna-docusate  1 tablet Oral QHS  . tiZANidine  4 mg Oral TID   Continuous Infusions:  PRN Meds:.albuterol, morphine injection Assessment/Plan: Active Problems:   Chest pain Katie Mccarthy is a 63 yo woman with a history of CAD (s/p CABG in 2013), HTN, DMII and HIV who presented from the office of her PCP with chest pain. Compiling information from the patient, the initial interview, and the PCP office, the cause of her pain remains unclear. The PCP was concerned for ACS; however, her EKG and troponins were negative, and her pain has resolved. Her symptoms may represent unstable angina pectoris, as she has chronic chest pain at rest; however, she is unable to determine if her pain worsens or is better with exercise, as she does not exert herself. There may be an element of neuropathic pain secondary to her CABG, as her symptoms are chronic and in the area of her median sternotomy. She did have exquisite pain to palpation in that area, but she states that yesterday's pain was slightly different (but in the same area). Additionally, she does admit to increased stressors at home lately; anxiety can contribute to or cause chest pain. Finally, she likely has a component of reflux esophagitis, as the patient describes a "burning" quality to her pain and a foul taste in her mouth; she is not on a PPI. Regardless, the pain appears to have correlated with her elevated BP and has resolved as her BPs have been brought down on her home antihypertensives.  Chronic Chest Pain: Patient is currently asymptomatic. EKGs and troponins negative. PCP has set her up with an outpatient cardiologist. This episode appears to have been related to elevated BP in the setting of  the patient missing her BP medications. Most recent lipids on file (Care Everywhere): cholesterol 241, triglycerides 172, HDL 52, VLDL 155. - Appreciate cardiology consult - Continue BP medications and prasugrel 10 mg daily; consider adding a statin; will touch base with Dr. Baxter Flattery on her records of most recent lipid panel - Continue home asa 81 daily - Initiate Protonix to treat any contributing reflux  Hypertensive Urgency: BP as high as 210/100 in ER, but now back on home BP medications and hypertenion is now resolved. There was no evidence of other end organ damage. Patient had not taken her daily medications and admits to some noncompliance. Her pain is likely related to her elevated pressures. Patient's meds have been refilled by her PCP. She has follow up with her PCP in 2 weeks. UA negative  for protein or ketones. - Continue home amlodipine 10 mg daily, Coreg 12.5 mg BID, clonidine 0.1 mg BID, Imdur 60 mg daily, lisinopril 40 mg daily  Medication Side Effect: patient reports one pill that makes her "loopy"; she doesn't take it regularly because of this. - Patient's neighbor to bring in her pills this afternoon; patient says she can identify the pill with the SE. If a BP pill, will find an alternative. If gabapentin, her noncompliance may be related to her pain  DMII: Not on insulin. A1C 6.3% in Care Everywhere. CKD stage II, Cr stable at 0.92.  - Hold home metformin 500 mg daily; to resume at home - SSI sensitive - A1c pending  HIV: Well controlled by Dr. Baxter Flattery. Last CD4 720, VL <20 12/15/13 on Atripla.  -Continue home Atripla 600-200-300 mg QHS; consider switch to Abacavir now that her HLA-B [id number] lab is negative  Chronic Back Pain: Reports having a slipped disk in her spine.  -Continue home gapapentin 300 mg BID -Continue home tizanidine 4 mg TID  ?COPD: On albuterol inhaler at home and has a smoking history. Says she was started on albuterol after her heart surgery.  -Continue home  albuterol inhaler q6h PRN  DVT prophylaxis: -Heparin.    Diet: - HH/carb mod  Dispo: Disposition is deferred at this time, awaiting improvement of current medical problems.  Anticipated discharge in approximately 0 day(s).   The patient does have a current PCP Katie Dials, Katie Mccarthy) (now Carola Rhine) and does not need an [facility] hospital follow-up appointment after discharge.  The patient does not have transportation limitations that hinder transportation to clinic appointments.  .Services Needed at time of discharge: Y = Yes, Blank = No PT:   OT:   RN:   Equipment:   Other:     LOS: 1 day   Drucilla Schmidt, MD 02/04/2014, 11:50 AM

## 2014-02-04 NOTE — Progress Notes (Signed)
Patient ID: Katie Mccarthy, female   DOB: Mar 16, 1951, 63 y.o.   MRN: [id number]        Attending progress note    Date of Admission:  02/03/2014     Active Problems:   Chest pain   . amLODipine  10 mg Oral Daily  . aspirin  81 mg Oral Daily  . carvedilol  12.5 mg Oral BID WC  . cloNIDine  0.1 mg Oral BID  . efavirenz-emtricitabine-tenofovir  1 tablet Oral QHS  . gabapentin  300 mg Oral BID  . heparin  5,000 Units Subcutaneous 3 times per day  . insulin aspart  0-9 Units Subcutaneous TID WC  . isosorbide mononitrate  60 mg Oral Daily  . lisinopril  40 mg Oral Daily  .  morphine injection  4 mg Intravenous Once  . pantoprazole  40 mg Oral Daily  . prasugrel  10 mg Oral Daily  . senna-docusate  1 tablet Oral QHS  . tiZANidine  4 mg Oral TID    I have seen Ms. Laurance Flatten and discussed her case with Dr. Venita Lick and Dr. Duwaine Maxin. She has chronic chest pain which may be related to prior bypass surgery and median sternotomy, stress and/or reflux esophagitis. She does not appear to have an acute coronary syndrome. Her blood pressure was elevated on admission. She states that she usually takes her blood pressure medication at bedtime. She states that one of her medications makes her feel loopy so she has not been taking it. She's not sure what that medication is. We will see her family can bring in her home medications to do complete med reconciliation. She is followed by my partner, Dr. Carlyle Basques, for her HIV infection. Her viral load is undetectable and her CD4 count is normal. At the time of her last visit in our clinic her creatinine had bumped up to 1.39. Dr. Baxter Flattery ordered an HLA B [phone] which was negative. We will switch Atripla to Triumeq in order to get her off of the tenofovir component of Atripla which can cause nephrotoxicity even know her creatinine has now normalized.  Michel Bickers, MD [facility] [phone] pager    [phone] cell 02/04/2014, 2:01 PM

## 2014-02-04 NOTE — Progress Notes (Signed)
UR completed 

## 2014-02-04 NOTE — Progress Notes (Signed)
MEDICATION RELATED CONSULT NOTE - INITIAL   Pharmacy Consult for antiretroviral Indication: HIV  No Known Allergies  Patient Measurements: Height: 5\' 7"  (170.2 cm) Weight: 197 lb 4.8 oz (89.495 kg) IBW/kg (Calculated) : 61.6 Adjusted Body Weight:   Vital Signs: Temp: 98 F (36.7 C) (10/02 1148) Temp Source: Oral (10/02 1148) BP: 88/58 mmHg (10/02 1148) Pulse Rate: 74 (10/02 1148) Intake/Output from previous day: 10/01 0701 - 10/02 0700 In: -  Out: 1 [Urine:1] Intake/Output from this shift: Total I/O In: 500 [P.O.:500] Out: -   Labs:  Recent Labs  02/03/14 1807  WBC 6.0  HGB 12.7  HCT 39.3  PLT 317  CREATININE 0.92   Estimated Creatinine Clearance: 72.9 ml/min (by C-G formula based on Cr of 0.92).   Microbiology: No results found for this or any previous visit (from the past 720 hour(s)).  Medical History: Past Medical History  Diagnosis Date  . Coronary artery disease   . Hypertension   . Diabetes mellitus without complication   . HIV infection   . Sciatica     Medications:  Scheduled:  . abacavir-lamiVUDine  1 tablet Oral Daily  . amLODipine  10 mg Oral Daily  . aspirin  81 mg Oral Daily  . carvedilol  12.5 mg Oral BID WC  . cloNIDine  0.1 mg Oral BID  . dolutegravir  50 mg Oral Daily  . gabapentin  300 mg Oral BID  . heparin  5,000 Units Subcutaneous 3 times per day  . insulin aspart  0-9 Units Subcutaneous TID WC  . isosorbide mononitrate  60 mg Oral Daily  . lisinopril  40 mg Oral Daily  .  morphine injection  4 mg Intravenous Once  . pantoprazole  40 mg Oral Daily  . prasugrel  10 mg Oral Daily  . senna-docusate  1 tablet Oral QHS  . tiZANidine  4 mg Oral TID    Assessment: She is being switch to triumeq to avoid renal issues with tenofovir in Atripla. I spoke to her about the new meds. She understand for the switch. Since we don't have Triumeq here inpt, I have used separate components. At discharge, use Triumeq instead of Epzicom +  Dolutegravir  Plan:   Dc Atripla Start Epzicom + dolutegravir At [facility], sent out on TRIUMEQ 1 PO qday  Ulyses SouthwardMinh Pham, PharmD Pager: [phone] 02/04/2014 4:13 PM

## 2014-02-07 NOTE — Discharge Summary (Signed)
Name: Katie Mccarthy MRN: [id number] DOB: 01-Feb-1951 63 y.o. PCP: Aura Dials, PA-C  Date of Admission: 02/03/2014  5:59 PM Date of Discharge: 02/07/2014 Attending Physician: Michel Bickers, MD  Discharge Diagnosis: Active Problems:   Chest pain  Discharge Medications:   Medication List    STOP taking these medications       efavirenz-emtricitabine-tenofovir 600-200-300 MG per tablet  Commonly known as:  ATRIPLA     tiZANidine 4 MG tablet  Commonly known as:  ZANAFLEX      TAKE these medications       Abacavir-Dolutegravir-Lamivud 600-50-300 MG Tabs  Commonly known as:  TRIUMEQ  Take 1 tablet by mouth at bedtime.     albuterol 108 (90 BASE) MCG/ACT inhaler  Commonly known as:  PROVENTIL HFA;VENTOLIN HFA  Inhale into the lungs every 6 (six) hours as needed for wheezing or shortness of breath.     amLODipine 10 MG tablet  Commonly known as:  NORVASC  Take 10 mg by mouth daily.     aspirin 81 MG chewable tablet  Chew 81 mg by mouth daily.     carvedilol 12.5 MG tablet  Commonly known as:  COREG  Take 1 tablet (12.5 mg total) by mouth 2 (two) times daily with a meal.     cloNIDine 0.1 MG tablet  Commonly known as:  CATAPRES  Take 0.1 mg by mouth 2 (two) times daily.     EFFIENT 10 MG Tabs tablet  Generic drug:  prasugrel  Take 10 mg by mouth daily.     EXCEDRIN PO  Take 1 tablet by mouth as needed (headache).     gabapentin 300 MG capsule  Commonly known as:  NEURONTIN  Take 300 mg by mouth 2 (two) times daily.     ibuprofen 600 MG tablet  Commonly known as:  ADVIL,MOTRIN  Take 1 tablet (600 mg total) by mouth every 6 (six) hours as needed for pain.     isosorbide mononitrate 60 MG 24 hr tablet  Commonly known as:  IMDUR  Take 60 mg by mouth daily.     lisinopril 40 MG tablet  Commonly known as:  PRINIVIL,ZESTRIL  Take 1 tablet (40 mg total) by mouth daily.     metFORMIN 500 MG tablet  Commonly known as:  GLUCOPHAGE  Take 500 mg by mouth daily  with breakfast.     pantoprazole 40 MG tablet  Commonly known as:  PROTONIX  Take 1 tablet (40 mg total) by mouth daily.        Disposition and follow-up:   Katie Mccarthy was discharged from [facility] in Stable condition.  At the hospital follow up visit please address:  1.  Katie Mccarthy was here for chest pain and hypertension noted at her PCP office. Her symptoms all resolved with the re-initiation of her home anti-hypertensives. Several other medication changes were made on this admission:  Zanaflex was d/c due to side effects reported by the patient (she had actually stopped taking the medication on her own)  The patient's home Atripla was switched to Triumeq (after discussion with Dr. Baxter Flattery) now that her HLA-B 5701 lab has come back negative.  If chest pain continues, consider increasing gabapentin dosing.  2.  Labs / imaging needed at time of follow-up: none  3.  Pending labs/ test needing follow-up: none  Follow-up Appointments:     Follow-up Information   Follow up with SPENCER,SARA C, PA-C On 02/21/2014. ((Lottie Dawson)  2:15)    Specialty:  Physician Assistant   Contact information:   [place] [address] [phone]       Discharge Instructions: Discharge Instructions   Diet - low sodium heart healthy    Complete by:  As directed      Increase activity slowly    Complete by:  As directed           Consultations:  none  Procedures Performed:  Dg Chest Port 1 View  02/03/2014   CLINICAL DATA:  63 year old female with left-sided chest pain  EXAM: PORTABLE CHEST - 1 VIEW  COMPARISON:  CT 08/20/2012, chest x-ray 08/20/2012  FINDINGS: Cardiomediastinal silhouette unchanged in size and contour. Surgical changes of median sternotomy and prior CABG.  No confluent airspace disease. No pneumothorax. No pleural effusion.  No displaced fracture.  IMPRESSION: No radiographic evidence of acute cardiopulmonary disease.   Changes of prior median sternotomy and CABG  Signed,  Dulcy Fanny. Earleen Newport, DO  [facility]  [facility]   Electronically Signed   By: Corrie Mckusick O.D.   On: 02/03/2014 18:34   Admission HPI:  Katie Mccarthy is a 63 year old woman with history of CAD s/p CABG in 2013, HTN, DM2, and HIV presenting with chest pain.  Katie Mccarthy reports chronic left sided chest pain and mild shortness of breath since her CABG surgery in 2013 done in [place]. The pain is intermittent burning and chest tightness that is reproducible with palpation of her chest. She reports that the pain worsened one week ago. She says that nothing makes the pain better or worse, but she says she is unable to do any physical exertion due to her chronic back pain. She has had some nausea and vomiting for the last week as well. She says she felt nauseous this morning when she woke up and but was unable to throw much up. She says that prior to her CABG surgery, she had pain under her left arm pit. She reports having a stent placed prior to her CABG and two placed since that time. She moved to [place] a year ago and saw a cardiologist in [place] once last year. She reports having stress testing done at that time that was negative. She says that she has not taken her blood pressure medication today because "she wanted to relax today." She denies radiation of the pain, diaphoresis, dizziness, edema, or orthopnea.  In the ER, her blood pressure was elevated to 210/105. EKG showed no new signs of ischemia and POCT troponin was negative. Chest x-ray showed no signs of acute disease. She was given aspirin 324 mg, nitroglycerin SL x3, morphine 4 mg IV, and her home dose of lisinopril 40 mg and clonidine 0.1 mg.  Admission Physical Exam: Constitutional: She is oriented to person, place, and time and well-developed, well-nourished, and in no distress. No distress.  HENT:  Head: Normocephalic and atraumatic.    Eyes: Conjunctivae and EOM are normal. Pupils are equal, round, and reactive to light. No scleral icterus.  Neck: Normal range of motion. Neck supple. No thyromegaly present.  Cardiovascular: Normal rate and regular rhythm.  Loud S2, denies having a mechanical valve.  Pulmonary/Chest: Effort normal and breath sounds normal. No respiratory distress. She has no wheezes. She has no rales. She exhibits tenderness (Exquisite tenderness with palpation of left chest wall.).  Abdominal: Soft. Bowel sounds are normal. She exhibits no distension. There is no tenderness.  Musculoskeletal:  Normal range of motion. She exhibits no edema and no tenderness.  Large tender bunyon on left foot.  Neurological: She is alert and oriented to person, place, and time. No cranial nerve deficit. She exhibits normal muscle tone.  Skin: Skin is warm and dry. She is not diaphoretic.   Hospital Course by problem list: Active Problems:   Chest pain Katie Mccarthy is a 64 yo woman with a history of CAD (s/p CABG in 2013), HTN, DMII and HIV who presented from the office of her PCP with chest pain and hypertensive urgency. Compiling information from the patient, the initial interview, and the PCP, the cause of her pain remains unclear. The PCP was concerned for ACS and was unable to complete an EKG at the office; however, her EKG and troponins in the hospital were negative, and her pain resolved. Her symptoms may represent unstable angina pectoris, as she has chronic chest pain at rest; however, she is unable to determine if her pain worsens or is better with exercise, as she "never" exerts herself. There may be an element of neuropathic pain secondary to her CABG, as her symptoms are chronic and in the area of her median sternotomy. She did have exquisite pain to palpation in that area, but she states that yesterday's pain was slightly different (but in the same area). Additionally, she does admit to increased stressors at home lately;  anxiety can contribute to or cause chest pain. Finally, she likely has a component of reflux esophagitis, as the patient describes a "burning" quality to her pain and a foul taste in her mouth; she is not on a PPI. Regardless, the pain appears to have correlated with her elevated BP and has resolved as her BPs have been brought down on her home antihypertensives.  Chronic Chest Pain: Patient occasionally experienced her baseline chest pain once in the hospital. EKGs and troponins were negative. PCP has set her up with an outpatient cardiologist. This episode of chest pain at her PCP office appears to have been related to elevated BP in the setting of the patient missing her BP medications. Most recent lipids on file (Care Everywhere): cholesterol 241, triglycerides 172, HDL 52, VLDL 155. Patient's home hypertensives were continued and protonix was added to her regimen.  Hypertensive Urgency: BP as high as 210/100 in ER, but once home BP medications were reinitiated, her hypertension resolved. There was no evidence of other end organ damage. Patient had not taken her daily medications and admits to some noncompliance. Her pain is likely related to her elevated pressures. Patient's meds have been refilled by her PCP. She has a follow up appointment with her PCP in 2 weeks. UA negative for protein or ketones.   Medication Side Effect: patient reports one pill that makes her "loopy"; she doesn't take it regularly because of this. Patient's neighbor brought in her pills, and it was determined that this pill was zanaflex. Patient does not take it, as a result of its side effects, so it was d/c from her medication list.  DMII: Not on insulin. A1C 6.3%. CKD stage II, Cr stable at 0.92.   HIV: Well controlled by Dr. Baxter Flattery. Last CD4 720, VL <20 12/15/13 on Atripla.  Switch to Triumeq (after discussion with Dr. Baxter Flattery) now that her HLA-B 5701 lab is negative. Patient was visited by pharmacy to discuss the switch.    Chronic Back Pain: Reports having a slipped disk in her spine; pain was controlled in the hospital. D/c Zanaflex due to  side effects reported by the patient.  ?COPD: On albuterol inhaler at home and has a smoking history. Says she was started on albuterol after her heart surgery.   Discharge Vitals:   BP 88/58  Pulse 74  Temp(Src) 98 F (36.7 C) (Oral)  Resp 16  Ht _0  (1.702 m)  Wt 197 lb 4.8 oz (89.495 kg)  BMI 30.89 kg/m2  SpO2 100%  Discharge Labs:  No results found for this or any previous visit (from the past 24 hour(s)).  Signed: Drucilla Schmidt, MD 02/07/2014, 3:22 PM    Services Ordered on Discharge: none Equipment Ordered on Discharge: none

## 2014-02-14 ENCOUNTER — Other Ambulatory Visit (HOSPITAL_COMMUNITY): Payer: Self-pay | Admitting: Internal Medicine

## 2014-02-16 ENCOUNTER — Ambulatory Visit: Payer: Self-pay | Admitting: Internal Medicine

## 2014-02-18 ENCOUNTER — Other Ambulatory Visit (HOSPITAL_COMMUNITY): Payer: Self-pay | Admitting: Internal Medicine

## 2014-02-21 ENCOUNTER — Other Ambulatory Visit (HOSPITAL_COMMUNITY): Payer: Self-pay | Admitting: Internal Medicine

## 2014-03-07 ENCOUNTER — Encounter (HOSPITAL_COMMUNITY): Payer: Self-pay | Admitting: General Practice

## 2014-03-08 ENCOUNTER — Ambulatory Visit (INDEPENDENT_AMBULATORY_CARE_PROVIDER_SITE_OTHER): Payer: PRIVATE HEALTH INSURANCE | Admitting: Internal Medicine

## 2014-03-08 ENCOUNTER — Encounter: Payer: Self-pay | Admitting: Internal Medicine

## 2014-03-08 VITALS — BP 141/86 | HR 72 | Temp 98.4°F | Ht 67.0 in | Wt 203.0 lb

## 2014-03-08 DIAGNOSIS — R229 Localized swelling, mass and lump, unspecified: Secondary | ICD-10-CM

## 2014-03-08 DIAGNOSIS — I10 Essential (primary) hypertension: Secondary | ICD-10-CM

## 2014-03-08 DIAGNOSIS — Z21 Asymptomatic human immunodeficiency virus [HIV] infection status: Secondary | ICD-10-CM

## 2014-03-08 LAB — CBC WITH DIFFERENTIAL/PLATELET
BASOS PCT: 0 % (ref 0–1)
Basophils Absolute: 0 10*3/uL (ref 0.0–0.1)
Eosinophils Absolute: 0.2 10*3/uL (ref 0.0–0.7)
Eosinophils Relative: 3 % (ref 0–5)
HCT: 35.3 % — ABNORMAL LOW (ref 36.0–46.0)
Hemoglobin: 11.6 g/dL — ABNORMAL LOW (ref 12.0–15.0)
LYMPHS PCT: 37 % (ref 12–46)
Lymphs Abs: 2 10*3/uL (ref 0.7–4.0)
MCH: 27.4 pg (ref 26.0–34.0)
MCHC: 32.9 g/dL (ref 30.0–36.0)
MCV: 83.5 fL (ref 78.0–100.0)
MONOS PCT: 8 % (ref 3–12)
Monocytes Absolute: 0.4 10*3/uL (ref 0.1–1.0)
NEUTROS ABS: 2.8 10*3/uL (ref 1.7–7.7)
Neutrophils Relative %: 52 % (ref 43–77)
Platelets: 290 10*3/uL (ref 150–400)
RBC: 4.23 MIL/uL (ref 3.87–5.11)
RDW: 15.4 % (ref 11.5–15.5)
WBC: 5.3 10*3/uL (ref 4.0–10.5)

## 2014-03-08 LAB — COMPLETE METABOLIC PANEL WITH GFR
ALT: <8 U/L (ref 0–35)
AST: 11 U/L (ref 0–37)
Albumin: 3.9 g/dL (ref 3.5–5.2)
Alkaline Phosphatase: 70 U/L (ref 39–117)
BUN: 14 mg/dL (ref 6–23)
CO2: 25 meq/L (ref 19–32)
Calcium: 9.2 mg/dL (ref 8.4–10.5)
Chloride: 107 mEq/L (ref 96–112)
Creat: 0.99 mg/dL (ref 0.50–1.10)
GFR, EST AFRICAN AMERICAN: 71 mL/min
GFR, Est Non African American: 61 mL/min
Glucose, Bld: 124 mg/dL — ABNORMAL HIGH (ref 70–99)
Potassium: 3.9 mEq/L (ref 3.5–5.3)
SODIUM: 139 meq/L (ref 135–145)
TOTAL PROTEIN: 6.6 g/dL (ref 6.0–8.3)
Total Bilirubin: 0.2 mg/dL (ref 0.2–1.2)

## 2014-03-08 LAB — HEPATITIS B SURFACE ANTIBODY,QUALITATIVE: Hep B S Ab: NEGATIVE

## 2014-03-08 NOTE — Progress Notes (Signed)
Patient ID: Katie Mccarthy, female   DOB: [birth year], 63 y.o.   MRN: [id number]       Patient ID: Katie Mccarthy, female   DOB: [birth year], 63 y.o.   MRN: [id number]  HPI Katie Mccarthy is a 63yo F with well controlled hiv disease, CAD s/p CABG, DM, Gout, poorly controlled HTN with recent hospitalization for hypertensive urgency. She was switched to triumeq at this time, ruled out ACS, adjusted pain meds. She states that she has pain to left foot. She noticing increased firmness to localized part of plantar/arch aspect of her left foot, similar to past cyst. She doesn't feel like it is gout  Outpatient Encounter Prescriptions as of 03/08/2014  Medication Sig  . Abacavir-Dolutegravir-Lamivud (TRIUMEQ) 600-50-300 MG TABS Take 1 tablet by mouth at bedtime.  Marland Kitchen. amLODipine (NORVASC) 10 MG tablet Take 10 mg by mouth daily.  Marland Kitchen. aspirin 81 MG chewable tablet Chew 81 mg by mouth daily.   . Aspirin-Acetaminophen-Caffeine (EXCEDRIN PO) Take 1 tablet by mouth as needed (headache).   . carvedilol (COREG) 12.5 MG tablet Take 1 tablet (12.5 mg total) by mouth 2 (two) times daily with a meal.  . cloNIDine (CATAPRES) 0.1 MG tablet Take 0.1 mg by mouth 2 (two) times daily.  Marland Kitchen. gabapentin (NEURONTIN) 300 MG capsule Take 300 mg by mouth 2 (two) times daily.  . isosorbide mononitrate (IMDUR) 60 MG 24 hr tablet Take 60 mg by mouth daily.  Marland Kitchen. lisinopril (PRINIVIL,ZESTRIL) 40 MG tablet Take 1 tablet (40 mg total) by mouth daily.  . metFORMIN (GLUCOPHAGE) 500 MG tablet Take 500 mg by mouth daily with breakfast.  . pantoprazole (PROTONIX) 40 MG tablet Take 1 tablet (40 mg total) by mouth daily.  . prasugrel (EFFIENT) 10 MG TABS tablet Take 10 mg by mouth daily.  Marland Kitchen. albuterol (PROVENTIL HFA;VENTOLIN HFA) 108 (90 BASE) MCG/ACT inhaler Inhale into the lungs every 6 (six) hours as needed for wheezing or shortness of breath.  . [DISCONTINUED] ibuprofen (ADVIL,MOTRIN) 600 MG tablet Take 1 tablet (600 mg total) by mouth every 6 (six) hours as  needed for pain.     Patient Active Problem List   Diagnosis Date Noted  . HIV (human immunodeficiency virus infection) 04/27/2012    Priority: High  . Hypertension 04/27/2012    Priority: Medium  . Diabetes 04/27/2012    Priority: Medium  . Hypercholesteremia 04/27/2012    Priority: Medium  . Chest pain 02/03/2014  . LSIL (low grade squamous intraepithelial lesion) on Pap smear 12/30/2012  . Gout 04/27/2012  . Asthma 04/27/2012  . CAD (coronary artery disease) 04/27/2012  . 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 FOOT EXAM  01/25/1962  . OPHTHALMOLOGY EXAM  01/25/1962  . URINE MICROALBUMIN  01/25/1962  . COLONOSCOPY  01/25/2002  . ZOSTAVAX  01/26/2012  . PNEUMOCOCCAL POLYSACCHARIDE VACCINE (2) 03/06/2014     Review of Systems 10 point ros is reviewed, positive pertinents listed in hpi Physical Exam   BP 141/86 mmHg  Pulse 72  Temp(Src) 98.4 F (36.9 C) (Oral)  Ht 5\' 7"  (1.702 m)  Wt 203 lb (92.08 kg)  BMI 31.79 kg/m2 Physical Exam  Constitutional:  oriented to person, place, and time. appears well-developed and well-nourished. No distress.  HENT:  Mouth/Throat: Oropharynx is clear and moist. No oropharyngeal exudate.  Cardiovascular: Normal rate, regular rhythm and normal heart sounds. Exam reveals no gallop and no friction rub.  No murmur heard.  Pulmonary/Chest:  Effort normal and breath sounds normal. No respiratory distress.  has no wheezes.  Abdominal: Soft. Bowel sounds are normal.  exhibits no distension. There is no tenderness.  Lymphadenopathy: no cervical adenopathy.  Neurological: alert and oriented to person, place, and time.  Skin: Skin is warm and dry. No rash noted. No erythema.  Psychiatric: a normal mood and affect. His behavior is normal.   Lab Results  Component Value Date   CD4TCELL 38 12/15/2013   Lab Results  Component Value Date   CD4TABS 720 12/15/2013   CD4TABS 840 06/10/2013   CD4TABS 770 12/28/2012     Lab Results  Component Value Date   HIV1RNAQUANT <20 12/15/2013   Lab Results  Component Value Date   HEPBSAB NONREACTIVE 04/16/2012   No results found for: RPR  CBC Lab Results  Component Value Date   WBC 6.0 02/03/2014   RBC 4.76 02/03/2014   HGB 12.7 02/03/2014   HCT 39.3 02/03/2014   PLT 317 02/03/2014   MCV 82.6 02/03/2014   MCH 26.7 02/03/2014   MCHC 32.3 02/03/2014   RDW 15.0 02/03/2014   LYMPHSABS 1.8 12/15/2013   MONOABS 0.2 12/15/2013   EOSABS 0.1 12/15/2013   BASOSABS 0.0 12/15/2013   BMET Lab Results  Component Value Date   NA 145 02/03/2014   K 4.2 02/03/2014   CL 107 02/03/2014   CO2 22 02/03/2014   GLUCOSE 105* 02/03/2014   BUN 19 02/03/2014   CREATININE 0.92 02/03/2014   CALCIUM 9.7 02/03/2014   GFRNONAA 65* 02/03/2014   GFRAA 76* 02/03/2014     Assessment and Plan  hiv = well controlled, continue on current regimen  Foot nodule  = seeing podiatry  HTN = adequately controlled, continue on current regimen.

## 2014-03-09 LAB — T-HELPER CELL (CD4) - (RCID CLINIC ONLY)
CD4 % Helper T Cell: 37 % (ref 33–55)
CD4 T Cell Abs: 680 /uL (ref 400–2700)

## 2014-03-11 LAB — HIV-1 RNA QUANT-NO REFLEX-BLD: HIV 1 RNA Quant: <20 copies/mL (ref <20)

## 2014-03-16 ENCOUNTER — Other Ambulatory Visit: Payer: Self-pay | Admitting: Internal Medicine

## 2014-04-22 ENCOUNTER — Other Ambulatory Visit: Payer: Self-pay | Admitting: Internal Medicine

## 2014-04-22 DIAGNOSIS — B20 Human immunodeficiency virus [HIV] disease: Secondary | ICD-10-CM

## 2014-05-10 ENCOUNTER — Ambulatory Visit: Payer: Self-pay | Admitting: Internal Medicine

## 2014-06-09 ENCOUNTER — Ambulatory Visit (INDEPENDENT_AMBULATORY_CARE_PROVIDER_SITE_OTHER): Payer: Medicare Other | Admitting: Internal Medicine

## 2014-06-09 ENCOUNTER — Encounter: Payer: Self-pay | Admitting: Internal Medicine

## 2014-06-09 VITALS — BP 146/105 | HR 96 | Temp 97.8°F | Ht 67.0 in | Wt 197.0 lb

## 2014-06-09 DIAGNOSIS — I10 Essential (primary) hypertension: Secondary | ICD-10-CM

## 2014-06-09 DIAGNOSIS — R6881 Early satiety: Secondary | ICD-10-CM

## 2014-06-09 DIAGNOSIS — Z21 Asymptomatic human immunodeficiency virus [HIV] infection status: Secondary | ICD-10-CM | POA: Diagnosis not present

## 2014-06-09 DIAGNOSIS — R87612 Low grade squamous intraepithelial lesion on cytologic smear of cervix (LGSIL): Secondary | ICD-10-CM

## 2014-06-09 NOTE — Progress Notes (Signed)
Patient ID: Katie Mccarthy, female   DOB: 08/26/1950, 64 y.o.   MRN: [id number]       Patient ID: Katie Mccarthy, female   DOB: June 02, 1950, 64 y.o.   MRN: [id number]  HPI 64yo F with HIV disease, CD 4 count of 680/VL<20, on triumeq. Also has difficult to control HTN, HLD, CAD, DM (hgb a1c of 6.1 in oct). She reports early satiety. Unintentional weight loss of 20 over the last 10 months per our records. She is here with her husband, Onalee Hua. Denies any fever, chills, nightsweats. No diarrhea.  Outpatient Encounter Prescriptions as of 06/09/2014  Medication Sig  . albuterol (PROVENTIL HFA;VENTOLIN HFA) 108 (90 BASE) MCG/ACT inhaler Inhale into the lungs every 6 (six) hours as needed for wheezing or shortness of breath.  Marland Kitchen amLODipine (NORVASC) 10 MG tablet Take 10 mg by mouth daily.  Marland Kitchen aspirin 81 MG chewable tablet Chew 81 mg by mouth daily.   . Aspirin-Acetaminophen-Caffeine (EXCEDRIN PO) Take 1 tablet by mouth as needed (headache).   . carvedilol (COREG) 12.5 MG tablet Take 1 tablet (12.5 mg total) by mouth 2 (two) times daily with a meal.  . cloNIDine (CATAPRES) 0.1 MG tablet Take 0.1 mg by mouth 2 (two) times daily.  . isosorbide mononitrate (IMDUR) 60 MG 24 hr tablet Take 60 mg by mouth daily.  Marland Kitchen lisinopril (PRINIVIL,ZESTRIL) 40 MG tablet Take 1 tablet (40 mg total) by mouth daily.  . metFORMIN (GLUCOPHAGE) 500 MG tablet Take 500 mg by mouth daily with breakfast.  . metoprolol succinate (TOPROL-XL) 100 MG 24 hr tablet TAKE 1 TABLET BY MOUTH DAILY  . pantoprazole (PROTONIX) 40 MG tablet Take 1 tablet (40 mg total) by mouth daily.  . prasugrel (EFFIENT) 10 MG TABS tablet Take 10 mg by mouth daily.  . TRIUMEQ 600-50-300 MG TABS TAKE 1 TABLET BY MOUTH EVERY NIGHT AT BEDTIME  . CRESTOR 40 MG tablet   . gabapentin (NEURONTIN) 300 MG capsule Take 300 mg by mouth 2 (two) times daily.  Marland Kitchen oxaprozin (DAYPRO) 600 MG tablet      Patient Active Problem List   Diagnosis Date Noted  . HIV (human  immunodeficiency virus infection) 04/27/2012    Priority: High  . Hypertension 04/27/2012    Priority: Medium  . Diabetes 04/27/2012    Priority: Medium  . Hypercholesteremia 04/27/2012    Priority: Medium  . Chest pain 02/03/2014  . LSIL (low grade squamous intraepithelial lesion) on Pap smear 12/30/2012  . Gout 04/27/2012  . Asthma 04/27/2012  . CAD (coronary artery disease) 04/27/2012  . 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 FOOT EXAM  01/25/1962  . OPHTHALMOLOGY EXAM  01/25/1962  . URINE MICROALBUMIN  01/25/1962  . COLONOSCOPY  01/25/2002  . ZOSTAVAX  01/26/2012  . PNEUMOCOCCAL POLYSACCHARIDE VACCINE (2) 03/06/2014     Review of Systems 12 point ros is reviewed, early satiety and unintentional weight loss over the last 6 months. Physical Exam   BP 157/11 mmHg  Pulse 96  Temp(Src) 97.8 F (36.6 C) (Oral)  Ht  (1.702 m)  Wt 197 lb (89.359 kg)  BMI 30.85 kg/m2 Physical Exam  Constitutional:  oriented to person, place, and time. appears well-developed and well-nourished. No distress.  HENT:  Mouth/Throat: Oropharynx is clear and moist. No oropharyngeal exudate.  Cardiovascular: Normal rate, regular rhythm and normal heart sounds. Exam reveals no gallop and no friction rub.  No murmur heard.  Pulmonary/Chest:  Effort normal and breath sounds normal. No respiratory distress.  has no wheezes.  Abdominal: Soft. Bowel sounds are normal.  exhibits no distension. There is no tenderness.  Lymphadenopathy: no cervical adenopathy.  Skin: Skin is warm and dry. No rash noted. No erythema.  Psychiatric: a normal mood and affect. behavior is normal.   Lab Results  Component Value Date   CD4TCELL 37 03/08/2014   Lab Results  Component Value Date   CD4TABS 680 03/08/2014   CD4TABS 720 12/15/2013   CD4TABS 840 06/10/2013   Lab Results  Component Value Date   HIV1RNAQUANT <20 03/08/2014   Lab Results  Component Value Date   HEPBSAB NEG  03/08/2014   No results found for: RPR  CBC Lab Results  Component Value Date   WBC 5.3 03/08/2014   RBC 4.23 03/08/2014   HGB 11.6* 03/08/2014   HCT 35.3* 03/08/2014   PLT 290 03/08/2014   MCV 83.5 03/08/2014   MCH 27.4 03/08/2014   MCHC 32.9 03/08/2014   RDW 15.4 03/08/2014   LYMPHSABS 2.0 03/08/2014   MONOABS 0.4 03/08/2014   EOSABS 0.2 03/08/2014   BASOSABS 0.0 03/08/2014   BMET Lab Results  Component Value Date   NA 139 03/08/2014   K 3.9 03/08/2014   CL 107 03/08/2014   CO2 25 03/08/2014   GLUCOSE 124* 03/08/2014   BUN 14 03/08/2014   CREATININE 0.99 03/08/2014   CALCIUM 9.2 03/08/2014   GFRNONAA 61 03/08/2014   GFRAA 71 03/08/2014     Assessment and Plan  hiv = doing well with triomeq. Well controlled  htn = adequate on multiple drugs to control her BP  Early satiety= i wonder if it is related to her DM. Consider gastric emptying study. Her hemoglobin A1c was 6.0 in October. Recommend to follow up with her PCP to discuss weight loss and early satiety  lsil = need pap smear, overdue by our records. Will arrange for pap exam.

## 2014-06-16 ENCOUNTER — Other Ambulatory Visit: Payer: Self-pay | Admitting: Internal Medicine

## 2014-07-08 ENCOUNTER — Other Ambulatory Visit: Payer: Self-pay | Admitting: Internal Medicine

## 2014-08-15 ENCOUNTER — Emergency Department (HOSPITAL_COMMUNITY)
Admission: EM | Admit: 2014-08-15 | Discharge: 2014-08-15 | Disposition: A | Discharge status: Home / Self Care | Payer: Medicare Other | Attending: Emergency Medicine | Admitting: Emergency Medicine

## 2014-08-15 ENCOUNTER — Emergency Department (HOSPITAL_COMMUNITY): Payer: Medicare Other

## 2014-08-15 ENCOUNTER — Encounter (HOSPITAL_COMMUNITY): Payer: Self-pay | Admitting: General Practice

## 2014-08-15 DIAGNOSIS — Z9889 Other specified postprocedural states: Secondary | ICD-10-CM | POA: Diagnosis not present

## 2014-08-15 DIAGNOSIS — I2510 Atherosclerotic heart disease of native coronary artery without angina pectoris: Secondary | ICD-10-CM | POA: Diagnosis not present

## 2014-08-15 DIAGNOSIS — Z951 Presence of aortocoronary bypass graft: Secondary | ICD-10-CM | POA: Diagnosis not present

## 2014-08-15 DIAGNOSIS — Z87891 Personal history of nicotine dependence: Secondary | ICD-10-CM | POA: Insufficient documentation

## 2014-08-15 DIAGNOSIS — J069 Acute upper respiratory infection, unspecified: Secondary | ICD-10-CM | POA: Insufficient documentation

## 2014-08-15 DIAGNOSIS — R0789 Other chest pain: Secondary | ICD-10-CM

## 2014-08-15 DIAGNOSIS — I10 Essential (primary) hypertension: Secondary | ICD-10-CM | POA: Diagnosis not present

## 2014-08-15 DIAGNOSIS — B20 Human immunodeficiency virus [HIV] disease: Secondary | ICD-10-CM | POA: Insufficient documentation

## 2014-08-15 DIAGNOSIS — M5430 Sciatica, unspecified side: Secondary | ICD-10-CM | POA: Insufficient documentation

## 2014-08-15 DIAGNOSIS — R079 Chest pain, unspecified: Secondary | ICD-10-CM | POA: Diagnosis present

## 2014-08-15 DIAGNOSIS — Z79899 Other long term (current) drug therapy: Secondary | ICD-10-CM | POA: Insufficient documentation

## 2014-08-15 DIAGNOSIS — E119 Type 2 diabetes mellitus without complications: Secondary | ICD-10-CM | POA: Diagnosis not present

## 2014-08-15 DIAGNOSIS — Z7982 Long term (current) use of aspirin: Secondary | ICD-10-CM | POA: Diagnosis not present

## 2014-08-15 LAB — CBC WITH DIFFERENTIAL/PLATELET
BASOS PCT: 0 % (ref 0–1)
Basophils Absolute: 0 10*3/uL (ref 0.0–0.1)
Eosinophils Absolute: 0.2 10*3/uL (ref 0.0–0.7)
Eosinophils Relative: 3 % (ref 0–5)
HCT: 35.5 % — ABNORMAL LOW (ref 36.0–46.0)
HEMOGLOBIN: 11.5 g/dL — AB (ref 12.0–15.0)
Lymphocytes Relative: 35 % (ref 12–46)
Lymphs Abs: 2.3 10*3/uL (ref 0.7–4.0)
MCH: 27.9 pg (ref 26.0–34.0)
MCHC: 32.4 g/dL (ref 30.0–36.0)
MCV: 86.2 fL (ref 78.0–100.0)
MONOS PCT: 11 % (ref 3–12)
Monocytes Absolute: 0.7 10*3/uL (ref 0.1–1.0)
NEUTROS ABS: 3.4 10*3/uL (ref 1.7–7.7)
Neutrophils Relative %: 51 % (ref 43–77)
PLATELETS: 275 10*3/uL (ref 150–400)
RBC: 4.12 MIL/uL (ref 3.87–5.11)
RDW: 14.5 % (ref 11.5–15.5)
WBC: 6.6 10*3/uL (ref 4.0–10.5)

## 2014-08-15 LAB — COMPREHENSIVE METABOLIC PANEL
ALBUMIN: 3.4 g/dL — AB (ref 3.5–5.2)
ALK PHOS: 71 U/L (ref 39–117)
ALT: 11 U/L (ref 0–35)
ANION GAP: 9 (ref 5–15)
AST: 17 U/L (ref 0–37)
BILIRUBIN TOTAL: 0.6 mg/dL (ref 0.3–1.2)
BUN: 13 mg/dL (ref 6–23)
CHLORIDE: 104 mmol/L (ref 96–112)
CO2: 25 mmol/L (ref 19–32)
Calcium: 9.1 mg/dL (ref 8.4–10.5)
Creatinine, Ser: 1.14 mg/dL — ABNORMAL HIGH (ref 0.50–1.10)
GFR, EST AFRICAN AMERICAN: 58 mL/min — AB (ref >90)
GFR, EST NON AFRICAN AMERICAN: 50 mL/min — AB (ref >90)
Glucose, Bld: 88 mg/dL (ref 70–99)
POTASSIUM: 4.1 mmol/L (ref 3.5–5.1)
SODIUM: 138 mmol/L (ref 135–145)
Total Protein: 7 g/dL (ref 6.0–8.3)

## 2014-08-15 LAB — TROPONIN I

## 2014-08-15 MED ORDER — MORPHINE SULFATE 4 MG/ML IJ SOLN
4.0000 mg | Freq: Once | INTRAMUSCULAR | Status: AC
  Administered 2014-08-15: 4 mg via INTRAVENOUS
  Filled 2014-08-15: qty 1

## 2014-08-15 MED ORDER — DIPHENHYDRAMINE HCL 50 MG/ML IJ SOLN
25.0000 mg | Freq: Once | INTRAMUSCULAR | Status: AC
  Administered 2014-08-15: 25 mg via INTRAVENOUS
  Filled 2014-08-15: qty 1

## 2014-08-15 MED ORDER — ASPIRIN 81 MG PO CHEW
243.0000 mg | CHEWABLE_TABLET | Freq: Once | ORAL | Status: AC
  Administered 2014-08-15: 243 mg via ORAL
  Filled 2014-08-15: qty 3

## 2014-08-15 MED ORDER — NITROGLYCERIN 0.4 MG SL SUBL: 0.4000 mg | SUBLINGUAL_TABLET | SUBLINGUAL | Status: DC | PRN

## 2014-08-15 NOTE — ED Provider Notes (Signed)
CSN: [id number]     Arrival date & time 08/15/14  1623 History   First MD Initiated Contact with Patient 08/15/14 1626     Chief Complaint  Patient presents with  . Chest Pain     (Consider location/radiation/quality/duration/timing/severity/associated sxs/prior Treatment) HPI Katie Mccarthy is a 64 -year-old female with past medical history of HIV, CAD, status post CABG, stent placement 3 hypertension, diabetes who presents the ER complaining of chest pain. Patient states her chest pain has been ongoing for the past several years, this current episode constant over the past 2-3 days. Patient describes the discomfort in her chest as being an "aching/burning" sensation that begins in her left chest and radiates into her left arm. Patient states her pain is alleviated with positioning, better with lying on her right side, worse with lying back. Patient also reports worsening of her discomfort with coughing. Patient states these symptoms are similar to the signs and symptoms she was experiencing just prior to having her CABG. Patient reports also having associated cough and "cold like symptoms" for the past 2-3 days as well. Patient states her cough has been productive with a discolored sputum. Patient denies fever, chills, nausea, vomiting, palpitations, headache, dizziness, weakness, blurred vision, shortness of breath worse than baseline.  Past Medical History  Diagnosis Date  . Coronary artery disease   . Hypertension   . Diabetes mellitus without complication   . HIV infection   . Sciatica    Past Surgical History  Procedure Laterality Date  . Cardiac catheterization    . Coronary artery bypass graft     Family History  Problem Relation Age of Onset  . Diabetes Mother   . Heart disease Mother   . Diabetes Father   . Coronary artery disease Father   . Diabetes Sister   . Diabetes Brother    History  Substance Use Topics  . Smoking status: Former Smoker    Types: Cigarettes   Quit date: 11/23/2010  . Smokeless tobacco: Never Used  . Alcohol Use: No   OB History    Gravida Para Term Preterm AB TAB SAB Ectopic Multiple Living   3 0 0 0 3 0 3 0 0 0      Review of Systems  Constitutional: Negative for fever.  HENT: Negative for trouble swallowing.   Eyes: Negative for visual disturbance.  Respiratory: Positive for cough. Negative for shortness of breath.   Cardiovascular: Positive for chest pain.  Gastrointestinal: Negative for nausea, vomiting and abdominal pain.  Genitourinary: Negative for dysuria.  Musculoskeletal: Negative for neck pain.  Skin: Negative for rash.  Neurological: Negative for dizziness, weakness and numbness.  Psychiatric/Behavioral: Negative.       Allergies  Morphine and related  Home Medications   Prior to Admission medications   Medication Sig Start Date End Date Taking? Authorizing Provider  albuterol (PROVENTIL HFA;VENTOLIN HFA) 108 (90 BASE) MCG/ACT inhaler Inhale into the lungs every 6 (six) hours as needed for wheezing or shortness of breath.   Yes Historical Provider, MD  amLODipine (NORVASC) 10 MG tablet Take 10 mg by mouth daily.   Yes Historical Provider, MD  aspirin 81 MG chewable tablet Chew 81 mg by mouth daily.    Yes Historical Provider, MD  Aspirin-Acetaminophen-Caffeine (EXCEDRIN PO) Take 1 tablet by mouth as needed (headache).    Yes Historical Provider, MD  atorvastatin (LIPITOR) 40 MG tablet Take 40 mg by mouth daily.   Yes Historical Provider, MD  carvedilol (COREG) 12.5 MG tablet  Take 1 tablet (12.5 mg total) by mouth 2 (two) times daily with a meal. 01/17/14  Yes Judyann Munson, MD  cloNIDine (CATAPRES) 0.1 MG tablet Take 0.1 mg by mouth 2 (two) times daily.   Yes Historical Provider, MD  isosorbide mononitrate (IMDUR) 60 MG 24 hr tablet Take 60 mg by mouth daily.   Yes Historical Provider, MD  lisinopril (PRINIVIL,ZESTRIL) 40 MG tablet TAKE 1 TABLET BY MOUTH EVERY DAY 06/17/14  Yes Judyann Munson, MD    metFORMIN (GLUCOPHAGE) 500 MG tablet Take 500 mg by mouth daily with breakfast.   Yes Historical Provider, MD  pantoprazole (PROTONIX) 40 MG tablet Take 1 tablet (40 mg total) by mouth daily. 02/04/14  Yes Stark Bray, MD  prasugrel (EFFIENT) 10 MG TABS tablet Take 10 mg by mouth daily.   Yes Historical Provider, MD  TRIUMEQ 600-50-300 MG TABS TAKE 1 TABLET BY MOUTH EVERY NIGHT AT BEDTIME 04/25/14  Yes Judyann Munson, MD  gabapentin (NEURONTIN) 300 MG capsule Take 300 mg by mouth 2 (two) times daily.    Historical Provider, MD  metoprolol succinate (TOPROL-XL) 100 MG 24 hr tablet TAKE 1 TABLET BY MOUTH DAILY 01/20/14   Historical Provider, MD   BP 137/99 mmHg  Pulse 88  Temp(Src) 98.4 F (36.9 C) (Oral)  Resp 16  Ht  (1.702 m)  Wt 193 lb (87.544 kg)  BMI 30.22 kg/m2  SpO2 98% Physical Exam  Constitutional: She is oriented to person, place, and time. She appears well-developed and well-nourished. No distress.  HENT:  Head: Normocephalic and atraumatic.  Mouth/Throat: Oropharynx is clear and moist. No oropharyngeal exudate.  Eyes: Right eye exhibits no discharge. Left eye exhibits no discharge. No scleral icterus.  Neck: Normal range of motion.  Cardiovascular: Normal rate, regular rhythm, S1 normal, S2 normal, normal heart sounds and normal pulses.   No murmur heard. Pulmonary/Chest: Effort normal and breath sounds normal. No accessory muscle usage. No tachypnea. No respiratory distress.    Abdominal: Soft. There is no tenderness.  Musculoskeletal: Normal range of motion. She exhibits no edema or tenderness.  Neurological: She is alert and oriented to person, place, and time. No cranial nerve deficit. Coordination normal.  Skin: Skin is warm and dry. No rash noted. She is not diaphoretic.  Psychiatric: She has a normal mood and affect.  Nursing note and vitals reviewed.   ED Course  Procedures (including critical care time) Labs Review Labs Reviewed  CBC WITH  DIFFERENTIAL/PLATELET - Abnormal; Notable for the following:    Hemoglobin 11.5 (*)    HCT 35.5 (*)    All other components within normal limits  COMPREHENSIVE METABOLIC PANEL - Abnormal; Notable for the following:    Creatinine, Ser 1.14 (*)    Albumin 3.4 (*)    GFR calc non Af Amer 50 (*)    GFR calc Af Amer 58 (*)    All other components within normal limits  TROPONIN I    Imaging Review Dg Chest 2 View  08/15/2014   CLINICAL DATA:  Chest pain.  Coronary artery disease.  EXAM: CHEST  2 VIEW  COMPARISON:  02/03/2014  FINDINGS: The heart size and mediastinal contours are within normal limits. Both lungs are clear. No evidence pleural effusion. No mass or lymphadenopathy identified. Prior CABG again noted.  IMPRESSION: No active cardiopulmonary disease.   Electronically Signed   By: Myles Rosenthal M.D.   On: 08/15/2014 18:34     EKG Interpretation   Date/Time:  Monday  August 15 2014 16:34:05 EDT Ventricular Rate:  91 PR Interval:  149 QRS Duration: 81 QT Interval:  356 QTC Calculation: 438 R Axis:   -11 Text Interpretation:  Sinus rhythm Abnormal T, consider ischemia, lateral  leads since last tracing no significant change Confirmed by Effie Shy  MD,  Mechele Collin ([id number]) on 08/15/2014 5:33:16 PM      MDM   Final diagnoses:  Chest discomfort  Chest wall pain  Upper respiratory infection    Patient here with atypical chest pain which is pleuritic in nature by her description, however patient describes this as being similar to the discomfort she was experiencing prior to her CABG. Of note, patient states she has been experience this pain over the past 4 years since her CABG, history and physical are more indicative of a pleuritic chest pain in nature and etiology. Workup today largely unremarkable. Troponin negative. EKG without evidence of acute injury or ectopy. Labs reassuring, no concern for ACS. No concern for pneumonia or pneumothorax. Likely patient experiencing an upper respiratory  infection with cough associated which is causing some musculoskeletal chest wall pain. Although patient describes slight burning sensation, it appears she is described the same pain in the past few in previous visits. Do not believe this is related to an aortic dissection at this time, patient does not describe any pain radiating to her back or into her abdomen, patient is well-appearing on exam, in no acute distress. Pulses are equal bilaterally. Discharge patient and she is hemodynamically stable to follow-up with her primary care physician. Discussed return precautions with patient, and patient verbalized understanding and agreement of this plan. I encouraged patient to call or return to the ER with any worsening of symptoms or should she have any questions or concerns.  BP 137/99 mmHg  Pulse 88  Temp(Src) 98.4 F (36.9 C) (Oral)  Resp 16  Ht  (1.702 m)  Wt 193 lb (87.544 kg)  BMI 30.22 kg/m2  SpO2 98%  Signed,  Ladona Mow, PA-C 11:45 PM  Patient seen and discussed with Dr. Gray Bernhardt, MD  Ladona Mow, PA-C 08/15/14 2345  Mancel Bale, MD 08/16/14 [phone]

## 2014-08-15 NOTE — ED Provider Notes (Signed)
  Face-to-face evaluation   History: She reports daily chest pain is there constantly since her CABG, 4 years ago. She follows up regularly with her cardiologist, and PCP/infectious disease provider. No associated diaphoresis, nausea or vomiting.  Physical exam: Alert, calm, cooperative. No respiratory distress. Psychiatric alert, lucid.  Medical screening examination/treatment/procedure(s) were conducted as a shared visit with non-physician practitioner(s) and myself.  I personally evaluated the patient during the encounter    EKG Interpretation  Date/Time:  Monday August 15 2014 16:34:05 EDT Ventricular Rate:  91 PR Interval:  149 QRS Duration: 81 QT Interval:  356 QTC Calculation: 438 R Axis:   -11 Text Interpretation:  Sinus rhythm Abnormal T, consider ischemia, lateral leads since last tracing no significant change Confirmed by Effie ShyWENTZ  MD, Kyshawn Teal [phone]([id number]) on 08/15/2014 5:33:16 PM        Mancel BaleElliott Yaman Grauberger, MD 08/16/14 0045

## 2014-08-15 NOTE — ED Notes (Signed)
Patient understands discharge instructions. She states she is familiar with using My Chart and will pull up any future questions with My chart. She feels better knowing no life threatening conditions exist. Wheeled to lobby.

## 2014-08-15 NOTE — ED Notes (Signed)
Pt brought in via [facility] with complaints of central chest pain. Pt has been having this chest pain since 2012 after her CABG. Pt denies N/V. EMS V/S 150/104, HR 104, RR 20, 98% on RA.

## 2014-08-15 NOTE — Discharge Instructions (Signed)
Chest Wall Pain °Chest wall pain is pain in or around the bones and muscles of your chest. It may take up to 6 weeks to get better. It may take longer if you must stay physically active in your work and activities.  °CAUSES  °Chest wall pain may happen on its own. However, it may be caused by: °· A viral illness like the flu. °· Injury. °· Coughing. °· Exercise. °· Arthritis. °· Fibromyalgia. °· Shingles. °HOME CARE INSTRUCTIONS  °· Avoid overtiring physical activity. Try not to strain or perform activities that cause pain. This includes any activities using your chest or your abdominal and side muscles, especially if heavy weights are used. °· Put ice on the sore area. °· Put ice in a plastic bag. °· Place a towel between your skin and the bag. °· Leave the ice on for 15-20 minutes per hour while awake for the first 2 days. °· Only take over-the-counter or prescription medicines for pain, discomfort, or fever as directed by your caregiver. °SEEK IMMEDIATE MEDICAL CARE IF:  °· Your pain increases, or you are very uncomfortable. °· You have a fever. °· Your chest pain becomes worse. °· You have new, unexplained symptoms. °· You have nausea or vomiting. °· You feel sweaty or lightheaded. °· You have a cough with phlegm (sputum), or you cough up blood. °MAKE SURE YOU:  °· Understand these instructions. °· Will watch your condition. °· Will get help right away if you are not doing well or get worse. °Document Released: 04/22/2005 Document Revised: 07/15/2011 Document Reviewed: 12/17/2010 °ExitCare® Patient Information ©2015 [employer]. This information is not intended to replace advice given to you by your health care provider. Make sure you discuss any questions you have with your health care provider. °Upper Respiratory Infection, Adult °An upper respiratory infection (URI) is also sometimes known as the common cold. The upper respiratory tract includes the nose, sinuses, throat, trachea, and bronchi. Bronchi are  the airways leading to the lungs. Most people improve within 1 week, but symptoms can last up to 2 weeks. A residual cough may last even longer.  °CAUSES °Many different viruses can infect the tissues lining the upper respiratory tract. The tissues become irritated and inflamed and often become very moist. Mucus production is also common. A cold is contagious. You can easily spread the virus to others by oral contact. This includes kissing, sharing a glass, coughing, or sneezing. Touching your mouth or nose and then touching a surface, which is then touched by another person, can also spread the virus. °SYMPTOMS  °Symptoms typically develop 1 to 3 days after you come in contact with a cold virus. Symptoms vary from person to person. They may include: °· Runny nose. °· Sneezing. °· Nasal congestion. °· Sinus irritation. °· Sore throat. °· Loss of voice (laryngitis). °· Cough. °· Fatigue. °· Muscle aches. °· Loss of appetite. °· Headache. °· Low-grade fever. °DIAGNOSIS  °You might diagnose your own cold based on familiar symptoms, since most people get a cold 2 to 3 times a year. Your caregiver can confirm this based on your exam. Most importantly, your caregiver can check that your symptoms are not due to another disease such as strep throat, sinusitis, pneumonia, asthma, or epiglottitis. Blood tests, throat tests, and X-rays are not necessary to diagnose a common cold, but they may sometimes be helpful in excluding other more serious diseases. Your caregiver will decide if any further tests are required. °RISKS AND COMPLICATIONS  °You may   be at risk for a more severe case of the common cold if you smoke cigarettes, have chronic heart disease (such as heart failure) or lung disease (such as asthma), or if you have a weakened immune system. The very young and very old are also at risk for more serious infections. Bacterial sinusitis, middle ear infections, and bacterial pneumonia can complicate the common cold. The  common cold can worsen asthma and chronic obstructive pulmonary disease (COPD). Sometimes, these complications can require emergency medical care and may be life-threatening. °PREVENTION  °The best way to protect against getting a cold is to practice good hygiene. Avoid oral or hand contact with people with cold symptoms. Wash your hands often if contact occurs. There is no clear evidence that vitamin C, vitamin E, echinacea, or exercise reduces the chance of developing a cold. However, it is always recommended to get plenty of rest and practice good nutrition. °TREATMENT  °Treatment is directed at relieving symptoms. There is no cure. Antibiotics are not effective, because the infection is caused by a virus, not by bacteria. Treatment may include: °· Increased fluid intake. Sports drinks offer valuable electrolytes, sugars, and fluids. °· Breathing heated mist or steam (vaporizer or shower). °· Eating chicken soup or other clear broths, and maintaining good nutrition. °· Getting plenty of rest. °· Using gargles or lozenges for comfort. °· Controlling fevers with ibuprofen or acetaminophen as directed by your caregiver. °· Increasing usage of your inhaler if you have asthma. °Zinc gel and zinc lozenges, taken in the first 24 hours of the common cold, can shorten the duration and lessen the severity of symptoms. Pain medicines may help with fever, muscle aches, and throat pain. A variety of non-prescription medicines are available to treat congestion and runny nose. Your caregiver can make recommendations and may suggest nasal or lung inhalers for other symptoms.  °HOME CARE INSTRUCTIONS  °· Only take over-the-counter or prescription medicines for pain, discomfort, or fever as directed by your caregiver. °· Use a warm mist humidifier or inhale steam from a shower to increase air moisture. This may keep secretions moist and make it easier to breathe. °· Drink enough water and fluids to keep your urine clear or pale  yellow. °· Rest as needed. °· Return to work when your temperature has returned to normal or as your caregiver advises. You may need to stay home longer to avoid infecting others. You can also use a face mask and careful hand washing to prevent spread of the virus. °SEEK MEDICAL CARE IF:  °· After the first few days, you feel you are getting worse rather than better. °· You need your caregiver's advice about medicines to control symptoms. °· You develop chills, worsening shortness of breath, or brown or red sputum. These may be signs of pneumonia. °· You develop yellow or brown nasal discharge or pain in the face, especially when you bend forward. These may be signs of sinusitis. °· You develop a fever, swollen neck glands, pain with swallowing, or white areas in the back of your throat. These may be signs of strep throat. °SEEK IMMEDIATE MEDICAL CARE IF:  °· You have a fever. °· You develop severe or persistent headache, ear pain, sinus pain, or chest pain. °· You develop wheezing, a prolonged cough, cough up blood, or have a change in your usual mucus (if you have chronic lung disease). °· You develop sore muscles or a stiff neck. °Document Released: 10/16/2000 Document Revised: 07/15/2011 Document Reviewed: 07/28/2013 °ExitCare® Patient   Information ©2015 [employer]. This information is not intended to replace advice given to you by your health care provider. Make sure you discuss any questions you have with your health care provider. ° °

## 2014-08-19 ENCOUNTER — Other Ambulatory Visit: Payer: Self-pay | Admitting: Internal Medicine

## 2014-08-22 ENCOUNTER — Other Ambulatory Visit: Payer: Self-pay

## 2014-08-22 MED ORDER — CARVEDILOL 12.5 MG PO TABS: 12.5000 mg | ORAL_TABLET | Freq: Two times a day (BID) | ORAL | Status: DC

## 2014-08-26 ENCOUNTER — Other Ambulatory Visit: Payer: Self-pay

## 2014-08-26 ENCOUNTER — Ambulatory Visit: Payer: Self-pay

## 2014-09-01 ENCOUNTER — Telehealth: Payer: Self-pay | Admitting: *Deleted

## 2014-09-01 NOTE — Telephone Encounter (Signed)
Pt missed lab and PAP smear appts.  Asked pt to call about having labs done tomorrow (09/02/14) prior to MD visit 09/08/14 w/ Dr. Drue SecondSnider.  Also asked her to make a new PAP smear appt.

## 2014-09-08 ENCOUNTER — Ambulatory Visit: Payer: Self-pay | Admitting: Internal Medicine

## 2014-09-15 ENCOUNTER — Ambulatory Visit: Payer: Self-pay | Admitting: Internal Medicine

## 2014-10-25 ENCOUNTER — Other Ambulatory Visit: Payer: Self-pay | Admitting: Internal Medicine

## 2014-10-25 DIAGNOSIS — B20 Human immunodeficiency virus [HIV] disease: Secondary | ICD-10-CM

## 2014-10-25 DIAGNOSIS — Z79899 Other long term (current) drug therapy: Secondary | ICD-10-CM

## 2014-11-04 ENCOUNTER — Other Ambulatory Visit: Payer: Medicare Other

## 2014-11-04 ENCOUNTER — Other Ambulatory Visit: Payer: Self-pay | Admitting: Internal Medicine

## 2014-11-04 DIAGNOSIS — Z79899 Other long term (current) drug therapy: Secondary | ICD-10-CM

## 2014-11-04 DIAGNOSIS — B20 Human immunodeficiency virus [HIV] disease: Secondary | ICD-10-CM

## 2014-11-04 LAB — RPR

## 2014-11-04 LAB — T-HELPER CELL (CD4) - (RCID CLINIC ONLY)
CD4 % Helper T Cell: 37 % (ref 33–55)
CD4 T Cell Abs: 790 /uL (ref 400–2700)

## 2014-11-04 LAB — LIPID PANEL
Cholesterol: 244 mg/dL — ABNORMAL HIGH (ref 0–200)
HDL: 61 mg/dL (ref >=46)
LDL Cholesterol: 166 mg/dL — ABNORMAL HIGH (ref 0–99)
Total CHOL/HDL Ratio: 4 Ratio
Triglycerides: 86 mg/dL (ref <150)
VLDL: 17 mg/dL (ref 0–40)

## 2014-11-09 LAB — HIV-1 RNA QUANT-NO REFLEX-BLD: HIV-1 RNA Quant, Log: <1.3 {Log} (ref <1.30)

## 2014-11-21 ENCOUNTER — Encounter: Payer: Self-pay | Admitting: Internal Medicine

## 2014-11-21 ENCOUNTER — Ambulatory Visit (INDEPENDENT_AMBULATORY_CARE_PROVIDER_SITE_OTHER): Payer: Medicare Other | Admitting: Internal Medicine

## 2014-11-21 VITALS — BP 144/87 | HR 76 | Temp 97.7°F | Wt 201.0 lb

## 2014-11-21 DIAGNOSIS — I10 Essential (primary) hypertension: Secondary | ICD-10-CM

## 2014-11-21 DIAGNOSIS — M5440 Lumbago with sciatica, unspecified side: Secondary | ICD-10-CM | POA: Diagnosis not present

## 2014-11-21 DIAGNOSIS — B20 Human immunodeficiency virus [HIV] disease: Secondary | ICD-10-CM | POA: Diagnosis not present

## 2014-11-21 DIAGNOSIS — E7800 Pure hypercholesterolemia, unspecified: Secondary | ICD-10-CM

## 2014-11-21 LAB — BASIC METABOLIC PANEL WITH GFR
BUN: 13 mg/dL (ref 6–23)
CALCIUM: 9.6 mg/dL (ref 8.4–10.5)
CO2: 24 mEq/L (ref 19–32)
Chloride: 107 mEq/L (ref 96–112)
Creat: 1.13 mg/dL — ABNORMAL HIGH (ref 0.50–1.10)
GFR, Est African American: 60 mL/min
GFR, Est Non African American: 52 mL/min — ABNORMAL LOW
Glucose, Bld: 111 mg/dL — ABNORMAL HIGH (ref 70–99)
POTASSIUM: 4 meq/L (ref 3.5–5.3)
Sodium: 141 mEq/L (ref 135–145)

## 2014-11-21 MED ORDER — GABAPENTIN 300 MG PO CAPS: 300.0000 mg | ORAL_CAPSULE | Freq: Three times a day (TID) | ORAL | Status: AC

## 2014-11-21 NOTE — Progress Notes (Signed)
Patient ID: Weber Cooks, female   DOB: [birth year], 64 y.o.   MRN: [id number]       Patient ID: VERDIE WILMS, female   DOB: 06/17/1950, 64 y.o.   MRN: [id number]  HPI  64yo F with HIV disease, CAD, HLD, DM who has well controlled HIV disease, CD 4 count of 790/VL<20 on triomeq. She reports good adherence to hiv meds as well as all other htn medication. She still complains of mid-low back pain that radiates down legs. She is not currently on neurontin. She denies having weakness but had a remote fall that she attributes to back pain. She has upcoming appt for evaluation with back surgeon, per patient.  Outpatient Encounter Prescriptions as of 11/21/2014  Medication Sig  . albuterol (PROVENTIL HFA;VENTOLIN HFA) 108 (90 BASE) MCG/ACT inhaler Inhale into the lungs every 6 (six) hours as needed for wheezing or shortness of breath.  Marland Kitchen amLODipine (NORVASC) 10 MG tablet Take 10 mg by mouth daily.  Marland Kitchen aspirin 81 MG chewable tablet Chew 81 mg by mouth daily.   . Aspirin-Acetaminophen-Caffeine (EXCEDRIN PO) Take 1 tablet by mouth as needed (headache).   Marland Kitchen atorvastatin (LIPITOR) 40 MG tablet Take 40 mg by mouth daily.  . carvedilol (COREG) 12.5 MG tablet Take 1 tablet (12.5 mg total) by mouth 2 (two) times daily with a meal.  . cloNIDine (CATAPRES) 0.1 MG tablet Take 0.1 mg by mouth 2 (two) times daily.  Marland Kitchen gabapentin (NEURONTIN) 300 MG capsule Take 1 capsule (300 mg total) by mouth 3 (three) times daily. Start with 1 tab daily x 5 days; then 1 tab twice/day x 5 days;then 3x/day  . isosorbide mononitrate (IMDUR) 60 MG 24 hr tablet Take 60 mg by mouth daily.  Marland Kitchen lisinopril (PRINIVIL,ZESTRIL) 40 MG tablet TAKE 1 TABLET BY MOUTH EVERY DAY  . metFORMIN (GLUCOPHAGE) 500 MG tablet Take 500 mg by mouth daily with breakfast.  . metoprolol succinate (TOPROL-XL) 100 MG 24 hr tablet TAKE 1 TABLET BY MOUTH DAILY  . pantoprazole (PROTONIX) 40 MG tablet Take 1 tablet (40 mg total) by mouth daily.  . prasugrel (EFFIENT) 10 MG  TABS tablet Take 10 mg by mouth daily.  . TRIUMEQ 600-50-300 MG TABS TAKE 1 TABLET BY MOUTH EVERY NIGHT AT BEDTIME  . [DISCONTINUED] gabapentin (NEURONTIN) 300 MG capsule Take 300 mg by mouth 2 (two) times daily.   No facility-administered encounter medications on file as of 11/21/2014.     Patient Active Problem List   Diagnosis Date Noted  . HIV (human immunodeficiency virus infection) 04/27/2012    Priority: High  . Hypertension 04/27/2012    Priority: Medium  . Diabetes 04/27/2012    Priority: Medium  . Hypercholesteremia 04/27/2012    Priority: Medium  . Chest pain 02/03/2014  . LSIL (low grade squamous intraepithelial lesion) on Pap smear 12/30/2012  . Gout 04/27/2012  . Asthma 04/27/2012  . CAD (coronary artery disease) 04/27/2012  . 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 FOOT EXAM  01/25/1962  . OPHTHALMOLOGY EXAM  01/25/1962  . URINE MICROALBUMIN  01/25/1962  . COLONOSCOPY  01/25/2002  . ZOSTAVAX  01/26/2012  . PNEUMOCOCCAL POLYSACCHARIDE VACCINE (2) 03/06/2014  . HEMOGLOBIN A1C  08/05/2014     Review of Systems + back pain, radiates down left leg. 10 point ros otherwise is negative Physical Exam   BP 144/87 mmHg  Pulse 76  Temp(Src) 97.7 F (36.5 C) (Oral)  Wt  201 lb (91.173 kg) Physical Exam  Constitutional:  oriented to person, place, and time. appears well-developed and well-nourished. No distress.  HENT: [place]/AT, PERRLA, no scleral icterus Mouth/Throat: Oropharynx is clear and moist. No oropharyngeal exudate.  Cardiovascular: Normal rate, regular rhythm and normal heart sounds. Exam reveals no gallop and no friction rub.  No murmur heard.  Pulmonary/Chest: Effort normal and breath sounds normal. No respiratory distress.  has no wheezes.  Neck = supple, Back = no spinal process tenderness or paraspinal point tenderness Lymphadenopathy: no cervical adenopathy. No axillary adenopathy  Psychiatric: a normal mood and affect.   behavior is normal.    Lab Results  Component Value Date   CD4TCELL 37 11/04/2014   Lab Results  Component Value Date   CD4TABS 790 11/04/2014   CD4TABS 680 03/08/2014   CD4TABS 720 12/15/2013   Lab Results  Component Value Date   HIV1RNAQUANT <20 11/04/2014   Lab Results  Component Value Date   HEPBSAB NEG 03/08/2014   No results found for: RPR  CBC Lab Results  Component Value Date   WBC 6.6 08/15/2014   RBC 4.12 08/15/2014   HGB 11.5* 08/15/2014   HCT 35.5* 08/15/2014   PLT 275 08/15/2014   MCV 86.2 08/15/2014   MCH 27.9 08/15/2014   MCHC 32.4 08/15/2014   RDW 14.5 08/15/2014   LYMPHSABS 2.3 08/15/2014   MONOABS 0.7 08/15/2014   EOSABS 0.2 08/15/2014   BASOSABS 0.0 08/15/2014   BMET Lab Results  Component Value Date   NA 138 08/15/2014   K 4.1 08/15/2014   CL 104 08/15/2014   CO2 25 08/15/2014   GLUCOSE 88 08/15/2014   BUN 13 08/15/2014   CREATININE 1.14* 08/15/2014   CALCIUM 9.1 08/15/2014   GFRNONAA 50* 08/15/2014   GFRAA 58* 08/15/2014     Assessment and Plan  HIV disease = well controlled, continue with triomeq. Will check bmp,   Hypertension = slighly elevated today, but has not taken metoprolol yet  Health maintenance = will check ua for proteinuria  Hyperlipidemia  = ldl still slightly elevated. continue with atorvastatin 40mg  qhs nad gave tips on diet modification  Back pain = appears radicular by descriptoin. Will have her start back on neurontin 300mg  TID

## 2014-12-02 ENCOUNTER — Other Ambulatory Visit: Payer: Self-pay | Admitting: Internal Medicine

## 2015-01-17 ENCOUNTER — Other Ambulatory Visit: Payer: Self-pay | Admitting: Neurological Surgery

## 2015-01-26 ENCOUNTER — Encounter (HOSPITAL_COMMUNITY)
Admission: RE | Admit: 2015-01-26 | Discharge: 2015-01-26 | Disposition: A | Discharge status: Home / Self Care | Payer: Medicare Other | Source: Ambulatory Visit | Attending: Neurological Surgery | Admitting: Neurological Surgery

## 2015-01-26 ENCOUNTER — Other Ambulatory Visit (HOSPITAL_COMMUNITY): Payer: Self-pay

## 2015-01-26 DIAGNOSIS — E119 Type 2 diabetes mellitus without complications: Secondary | ICD-10-CM | POA: Insufficient documentation

## 2015-01-26 DIAGNOSIS — Z87891 Personal history of nicotine dependence: Secondary | ICD-10-CM | POA: Diagnosis not present

## 2015-01-26 DIAGNOSIS — I10 Essential (primary) hypertension: Secondary | ICD-10-CM | POA: Insufficient documentation

## 2015-01-26 DIAGNOSIS — Z01812 Encounter for preprocedural laboratory examination: Secondary | ICD-10-CM | POA: Diagnosis not present

## 2015-01-26 DIAGNOSIS — B20 Human immunodeficiency virus [HIV] disease: Secondary | ICD-10-CM | POA: Diagnosis not present

## 2015-01-26 DIAGNOSIS — Z951 Presence of aortocoronary bypass graft: Secondary | ICD-10-CM | POA: Insufficient documentation

## 2015-01-26 DIAGNOSIS — M4317 Spondylolisthesis, lumbosacral region: Secondary | ICD-10-CM | POA: Diagnosis not present

## 2015-01-26 DIAGNOSIS — Z79899 Other long term (current) drug therapy: Secondary | ICD-10-CM | POA: Insufficient documentation

## 2015-01-26 DIAGNOSIS — I2510 Atherosclerotic heart disease of native coronary artery without angina pectoris: Secondary | ICD-10-CM | POA: Insufficient documentation

## 2015-01-26 DIAGNOSIS — Z7982 Long term (current) use of aspirin: Secondary | ICD-10-CM | POA: Insufficient documentation

## 2015-01-26 DIAGNOSIS — Z0183 Encounter for blood typing: Secondary | ICD-10-CM | POA: Diagnosis not present

## 2015-01-26 DIAGNOSIS — Z01818 Encounter for other preprocedural examination: Secondary | ICD-10-CM | POA: Insufficient documentation

## 2015-01-26 LAB — COMPREHENSIVE METABOLIC PANEL
ALT: 11 U/L — ABNORMAL LOW (ref 14–54)
ANION GAP: 8 (ref 5–15)
AST: 16 U/L (ref 15–41)
Albumin: 3.4 g/dL — ABNORMAL LOW (ref 3.5–5.0)
Alkaline Phosphatase: 79 U/L (ref 38–126)
BUN: 9 mg/dL (ref 6–20)
CALCIUM: 9.4 mg/dL (ref 8.9–10.3)
CHLORIDE: 109 mmol/L (ref 101–111)
CO2: 24 mmol/L (ref 22–32)
Creatinine, Ser: 1.05 mg/dL — ABNORMAL HIGH (ref 0.44–1.00)
GFR calc non Af Amer: 55 mL/min — ABNORMAL LOW (ref >60)
Glucose, Bld: 101 mg/dL — ABNORMAL HIGH (ref 65–99)
Potassium: 4.3 mmol/L (ref 3.5–5.1)
SODIUM: 141 mmol/L (ref 135–145)
Total Bilirubin: 0.1 mg/dL — ABNORMAL LOW (ref 0.3–1.2)
Total Protein: 7.1 g/dL (ref 6.5–8.1)

## 2015-01-26 LAB — CBC
HEMATOCRIT: 37.7 % (ref 36.0–46.0)
HEMOGLOBIN: 12 g/dL (ref 12.0–15.0)
MCH: 27.6 pg (ref 26.0–34.0)
MCHC: 31.8 g/dL (ref 30.0–36.0)
MCV: 86.9 fL (ref 78.0–100.0)
Platelets: 291 10*3/uL (ref 150–400)
RBC: 4.34 MIL/uL (ref 3.87–5.11)
RDW: 14.1 % (ref 11.5–15.5)
WBC: 6.3 10*3/uL (ref 4.0–10.5)

## 2015-01-26 LAB — TYPE AND SCREEN
ABO/RH(D): O POS
ANTIBODY SCREEN: NEGATIVE

## 2015-01-26 LAB — SURGICAL PCR SCREEN
MRSA, PCR: NEGATIVE
STAPHYLOCOCCUS AUREUS: POSITIVE — AB

## 2015-01-26 LAB — ABO/RH: ABO/RH(D): O POS

## 2015-01-26 LAB — GLUCOSE, CAPILLARY: GLUCOSE-CAPILLARY: 96 mg/dL (ref 65–99)

## 2015-01-26 NOTE — Progress Notes (Signed)
I called a prescription for Mupirocin ointment to [facility], [place], [place].

## 2015-01-26 NOTE — Pre-Procedure Instructions (Signed)
Katie Mccarthy  01/26/2015      [facility] - [place]a H[place], [place] - [address] [facility] [address] AT [facility] [address] [place] [address] Phone: [phone] Fax: [phone]    Your procedure is scheduled on Tuesday, January 31, 2015  Report to [facility] Admitting at 9:30 A.M. ( per MD)  Call this number if you have problems the morning of surgery:  [phone]   Remember:  Do not eat food or drink liquids after midnight Monday, January 30, 2015  Take these medicines the morning of surgery with A SIP OF WATER: amLODipine (NORVASC),  carvedilol (COREG), cloNIDine (CATAPRES), gabapentin (NEURONTIN), isosorbide mononitrate (IMDUR), pantoprazole (PROTONIX), terbinafine (LAMISIL), if needed: nitroGLYCERIN (NITROSTAT)  for chest pain, albuterol (PROVENTIL HFA;VENTOLIN HFA) inhaler for wheezing or shortness of breath ( bring inhaler in with you on day of procedure).  EFFIENT AND ASPIRIN AS DIRECTED  Do not take oral diabetes medicines (pills) the morning of surgery such as metFORMIN (GLUCOPHAGE).  How to Manage Your Diabetes Before Surgery Why is it important to control my blood sugar before and after surgery? Improving blood sugar levels before and after surgery helps healing and can limit problems.  A way of improving blood sugar control is eating a healthy diet by:  - Eating less sugar and carbohydrates  - Increasing activity/exercise  - Talk with your doctor about reaching your blood sugar goals  High blood sugars (greater than 180 mg/dL) can raise your risk of infections and slow down your recovery so you will need to focus on controlling your diabetes during the weeks before surgery.  Make sure that the doctor who takes care of your diabetes knows about your planned surgery including the date and location.  How do I manage my blood sugars before surgery?   Check your blood sugar at least 4 times a day, 2 days before surgery to make  sure that they are not too high or low.   Check your blood sugar the morning of your surgery when you wake up and every 2               hours until you get to the Short-Stay unit.  If your blood sugar is less than 70 mg/dL, you will need to treat for low blood sugar by:  Treat a low blood sugar (less than 70 mg/dL) with 1/2 cup of clear juice (cranberry or apple), 4 glucose tablets, OR glucose gel.  Recheck blood sugar in 15 minutes after treatment (to make sure it is greater than 70 mg/dL).  If blood sugar is not greater than 70 mg/dL on re-check, call [phone] for further instructions.   Report your blood sugar to the Short-Stay nurse when you get to Short-Stay.  References:  [facility], 2007 "How to Manage your Diabetes Before and After Surgery".  Do not wear jewelry, make-up or nail polish.  Do not wear lotions, powders, or perfumes.  You may not wear deodorant.  Do not shave 48 hours prior to surgery.    Do not bring valuables to the hospital.  [facility] is not responsible for any belongings or valuables.  Contacts, dentures or bridgework may not be worn into surgery.  Leave your suitcase in the car.  After surgery it may be brought to your room.  For patients admitted to the hospital, discharge time will be determined by your treatment team.  Patients discharged the day of surgery will not  be allowed to drive home.   Name and phone number of your driver:   Special instructions: Shower the night before surgery and the morning of surgery with CHG.  Please read over the following fact sheets that you were given. Pain Booklet, Coughing and Deep Breathing, Blood Transfusion Information, MRSA Information and Surgical Site Infection Prevention

## 2015-01-26 NOTE — Progress Notes (Addendum)
PCP- Dr Karleen Hampshire Pt denies any recent SOB, denies chest pain CABG 2012 in [place], states "do not have a cardiologist" Pt stated last dose of aspirin and effient 01/23/2015

## 2015-01-27 ENCOUNTER — Other Ambulatory Visit: Payer: Self-pay | Admitting: Internal Medicine

## 2015-01-27 DIAGNOSIS — I10 Essential (primary) hypertension: Secondary | ICD-10-CM

## 2015-01-27 LAB — HEMOGLOBIN A1C
Hgb A1c MFr Bld: 6.9 % — ABNORMAL HIGH (ref 4.8–5.6)
Mean Plasma Glucose: 151 mg/dL

## 2015-01-27 NOTE — Progress Notes (Signed)
Anesthesia Chart Review:  Pt is 64 year old female scheduled for L4-5 maximum access PLIF on 01/31/2015 with Dr. Sharlet Salina Ditty.   PCP is Georgette Shell, [place] (notes in care everywhere), last office visit 01/05/15. Cardiologist is Dr. Nadara Eaton, last office visit 01/16/15.   PMH includes: CAD (s/p CABG in 2012: LIMA to LAD, SVG to RCA, SVG to diagonal, SVG OM), HTN, DM, HIV. Former smoker. BMI 32.   Medications include: albuterol, amlodipine, ASA, lipitor, carvedilol, clonidine, imdur, lisinopril, metformin, protonix, prasugrel, triumeq.   Preoperative labs reviewed.  Glucose 101, hgbA1c 6.9.   Chest x-ray 08/15/2014 reviewed. No active cardiopulmonary disease.   EKG 01/16/2015: Sinus rhythm. Poor R wave progression. T abnormality in I and AVL. Appears similar to tracings dating back to 08/20/2012.   Nuclear stress test 07/29/2013 (care everywhere):  -There is normal perfusion of the left ventricle. No reversible or fixed perfusion defects are identified. LV EF 69%  Cardiac cath 09/03/2011 (at [facility] in [facility].):  1. 3 vessel CAD 2. Three of four grafts patent 3. High-grade lesions in the SVG to the diagonal branch and in the SVG to the RCA 4. Successful BMS to SVG to RCA 5. Normal LV systolic function 6. Systemic HTN  PCI 09/05/2011: 1. BMS to SVG to the diagonal branch.   Echo 08/28/2011 ([facility] in [place].):  -Normal LV function and wall motion with LV EF 60-65% -Mild concentric LVH -Mild mitral annular calcification -Trace MR -No pericardial effusion  If no changes, I anticipate pt can proceed with surgery as scheduled.   Rica Mast, FNP-BC [facility] Short Stay Surgical Center/Anesthesiology Phone: [phone] 01/27/2015 3:28 PM

## 2015-01-31 ENCOUNTER — Encounter (HOSPITAL_COMMUNITY)
Admission: RE | Disposition: A | Discharge status: Home / Self Care | Payer: Self-pay | Source: Ambulatory Visit | Attending: Neurological Surgery

## 2015-01-31 ENCOUNTER — Inpatient Hospital Stay (HOSPITAL_COMMUNITY): Payer: Medicare Other

## 2015-01-31 ENCOUNTER — Inpatient Hospital Stay (HOSPITAL_COMMUNITY): Payer: Medicare Other | Admitting: Anesthesiology

## 2015-01-31 ENCOUNTER — Inpatient Hospital Stay (HOSPITAL_COMMUNITY): Payer: Medicare Other | Admitting: Emergency Medicine

## 2015-01-31 ENCOUNTER — Encounter (HOSPITAL_COMMUNITY): Payer: Self-pay | Admitting: *Deleted

## 2015-01-31 ENCOUNTER — Inpatient Hospital Stay (HOSPITAL_COMMUNITY)
Admission: RE | Admit: 2015-01-31 | Discharge: 2015-02-01 | DRG: 460 | Disposition: A | Discharge status: Home / Self Care | Payer: Medicare Other | Source: Ambulatory Visit | Attending: Neurological Surgery | Admitting: Neurological Surgery

## 2015-01-31 DIAGNOSIS — Z87891 Personal history of nicotine dependence: Secondary | ICD-10-CM | POA: Diagnosis not present

## 2015-01-31 DIAGNOSIS — Z951 Presence of aortocoronary bypass graft: Secondary | ICD-10-CM | POA: Diagnosis not present

## 2015-01-31 DIAGNOSIS — M4806 Spinal stenosis, lumbar region: Secondary | ICD-10-CM | POA: Diagnosis present

## 2015-01-31 DIAGNOSIS — Z7982 Long term (current) use of aspirin: Secondary | ICD-10-CM | POA: Diagnosis not present

## 2015-01-31 DIAGNOSIS — E785 Hyperlipidemia, unspecified: Secondary | ICD-10-CM | POA: Diagnosis present

## 2015-01-31 DIAGNOSIS — E119 Type 2 diabetes mellitus without complications: Secondary | ICD-10-CM | POA: Diagnosis present

## 2015-01-31 DIAGNOSIS — M545 Low back pain: Secondary | ICD-10-CM | POA: Diagnosis present

## 2015-01-31 DIAGNOSIS — M5416 Radiculopathy, lumbar region: Secondary | ICD-10-CM | POA: Diagnosis present

## 2015-01-31 DIAGNOSIS — I10 Essential (primary) hypertension: Secondary | ICD-10-CM | POA: Diagnosis present

## 2015-01-31 DIAGNOSIS — Z419 Encounter for procedure for purposes other than remedying health state, unspecified: Secondary | ICD-10-CM

## 2015-01-31 DIAGNOSIS — K5900 Constipation, unspecified: Secondary | ICD-10-CM | POA: Diagnosis present

## 2015-01-31 DIAGNOSIS — I2510 Atherosclerotic heart disease of native coronary artery without angina pectoris: Secondary | ICD-10-CM | POA: Diagnosis present

## 2015-01-31 DIAGNOSIS — R296 Repeated falls: Secondary | ICD-10-CM | POA: Diagnosis present

## 2015-01-31 DIAGNOSIS — M4316 Spondylolisthesis, lumbar region: Principal | ICD-10-CM | POA: Diagnosis present

## 2015-01-31 DIAGNOSIS — M47816 Spondylosis without myelopathy or radiculopathy, lumbar region: Secondary | ICD-10-CM | POA: Diagnosis present

## 2015-01-31 HISTORY — PX: MAXIMUM ACCESS (MAS)POSTERIOR LUMBAR INTERBODY FUSION (PLIF) 1 LEVEL: SHX6368

## 2015-01-31 HISTORY — DX: Disease of blood and blood-forming organs, unspecified: D75.9

## 2015-01-31 LAB — GLUCOSE, CAPILLARY
GLUCOSE-CAPILLARY: 107 mg/dL — AB (ref 65–99)
GLUCOSE-CAPILLARY: 105 mg/dL — AB (ref 65–99)
GLUCOSE-CAPILLARY: 169 mg/dL — AB (ref 65–99)
Glucose-Capillary: 157 mg/dL — ABNORMAL HIGH (ref 65–99)

## 2015-01-31 SURGERY — FOR MAXIMUM ACCESS (MAS) POSTERIOR LUMBAR INTERBODY FUSION (PLIF) 1 LEVEL
Anesthesia: General

## 2015-01-31 MED ORDER — PHENYLEPHRINE HCL 10 MG/ML IJ SOLN
INTRAMUSCULAR | Status: AC
  Filled 2015-01-31: qty 2

## 2015-01-31 MED ORDER — SUCCINYLCHOLINE CHLORIDE 20 MG/ML IJ SOLN
INTRAMUSCULAR | Status: AC
  Filled 2015-01-31: qty 1

## 2015-01-31 MED ORDER — ACETAMINOPHEN 325 MG PO TABS: 650.0000 mg | ORAL_TABLET | ORAL | Status: DC | PRN

## 2015-01-31 MED ORDER — POLYETHYLENE GLYCOL 3350 17 G PO PACK: 17.0000 g | PACK | Freq: Every day | ORAL | Status: DC | PRN

## 2015-01-31 MED ORDER — ONDANSETRON HCL 4 MG/2ML IJ SOLN
INTRAMUSCULAR | Status: AC
  Filled 2015-01-31: qty 2

## 2015-01-31 MED ORDER — LIDOCAINE HCL (CARDIAC) 20 MG/ML IV SOLN
INTRAVENOUS | Status: DC | PRN
  Administered 2015-01-31: 100 mg via INTRAVENOUS

## 2015-01-31 MED ORDER — NEOSTIGMINE METHYLSULFATE 10 MG/10ML IV SOLN
INTRAVENOUS | Status: AC
  Filled 2015-01-31: qty 1

## 2015-01-31 MED ORDER — 0.9 % SODIUM CHLORIDE (POUR BTL) OPTIME
TOPICAL | Status: DC | PRN
  Administered 2015-01-31: 1000 mL

## 2015-01-31 MED ORDER — FENTANYL CITRATE (PF) 250 MCG/5ML IJ SOLN
INTRAMUSCULAR | Status: AC
  Filled 2015-01-31: qty 5

## 2015-01-31 MED ORDER — CARVEDILOL 6.25 MG PO TABS
6.2500 mg | ORAL_TABLET | Freq: Two times a day (BID) | ORAL | Status: DC
  Administered 2015-02-01: 6.25 mg via ORAL
  Filled 2015-01-31: qty 1

## 2015-01-31 MED ORDER — LIDOCAINE HCL (CARDIAC) 20 MG/ML IV SOLN
INTRAVENOUS | Status: AC
  Filled 2015-01-31: qty 5

## 2015-01-31 MED ORDER — PRASUGREL HCL 10 MG PO TABS
10.0000 mg | ORAL_TABLET | Freq: Every day | ORAL | Status: DC
  Administered 2015-01-31: 10 mg via ORAL
  Filled 2015-01-31 (×2): qty 1

## 2015-01-31 MED ORDER — DOCUSATE SODIUM 100 MG PO CAPS
100.0000 mg | ORAL_CAPSULE | Freq: Two times a day (BID) | ORAL | Status: DC
  Administered 2015-01-31: 100 mg via ORAL
  Filled 2015-01-31 (×2): qty 1

## 2015-01-31 MED ORDER — AMLODIPINE BESYLATE 10 MG PO TABS
10.0000 mg | ORAL_TABLET | Freq: Every day | ORAL | Status: DC
  Administered 2015-01-31: 10 mg via ORAL
  Filled 2015-01-31 (×2): qty 1

## 2015-01-31 MED ORDER — HYDROMORPHONE HCL 1 MG/ML IJ SOLN
INTRAMUSCULAR | Status: AC
  Filled 2015-01-31: qty 1

## 2015-01-31 MED ORDER — MIDAZOLAM HCL 5 MG/5ML IJ SOLN
INTRAMUSCULAR | Status: DC | PRN
  Administered 2015-01-31: 2 mg via INTRAVENOUS

## 2015-01-31 MED ORDER — METFORMIN HCL 500 MG PO TABS
500.0000 mg | ORAL_TABLET | Freq: Every day | ORAL | Status: DC
  Administered 2015-02-01: 500 mg via ORAL
  Filled 2015-01-31: qty 1

## 2015-01-31 MED ORDER — VANCOMYCIN HCL 1000 MG IV SOLR
INTRAVENOUS | Status: AC
  Filled 2015-01-31: qty 1000

## 2015-01-31 MED ORDER — ISOSORBIDE MONONITRATE ER 60 MG PO TB24
60.0000 mg | ORAL_TABLET | Freq: Every day | ORAL | Status: DC
  Administered 2015-01-31: 60 mg via ORAL
  Filled 2015-01-31 (×2): qty 1

## 2015-01-31 MED ORDER — PHENYLEPHRINE 40 MCG/ML (10ML) SYRINGE FOR IV PUSH (FOR BLOOD PRESSURE SUPPORT)
PREFILLED_SYRINGE | INTRAVENOUS | Status: AC
  Filled 2015-01-31: qty 10

## 2015-01-31 MED ORDER — CLONIDINE HCL 0.1 MG PO TABS
0.1000 mg | ORAL_TABLET | Freq: Two times a day (BID) | ORAL | Status: DC
  Administered 2015-01-31: 0.1 mg via ORAL
  Filled 2015-01-31 (×3): qty 1

## 2015-01-31 MED ORDER — LIDOCAINE-EPINEPHRINE 1 %-1:100000 IJ SOLN
INTRAMUSCULAR | Status: DC | PRN
  Administered 2015-01-31: 10 mL

## 2015-01-31 MED ORDER — CARVEDILOL 12.5 MG PO TABS
12.5000 mg | ORAL_TABLET | ORAL | Status: AC
  Administered 2015-01-31: 12.5 mg via ORAL
  Filled 2015-01-31: qty 1

## 2015-01-31 MED ORDER — ALBUTEROL SULFATE HFA 108 (90 BASE) MCG/ACT IN AERS
2.0000 | INHALATION_SPRAY | Freq: Four times a day (QID) | RESPIRATORY_TRACT | Status: DC | PRN
  Filled 2015-01-31: qty 6.7

## 2015-01-31 MED ORDER — PHENYLEPHRINE HCL 10 MG/ML IJ SOLN
INTRAMUSCULAR | Status: DC | PRN
  Administered 2015-01-31: 120 ug via INTRAVENOUS
  Administered 2015-01-31: 80 ug via INTRAVENOUS

## 2015-01-31 MED ORDER — HYDROMORPHONE HCL 1 MG/ML IJ SOLN
0.5000 mg | INTRAMUSCULAR | Status: DC | PRN
  Administered 2015-01-31: 0.5 mg via INTRAVENOUS

## 2015-01-31 MED ORDER — GABAPENTIN 300 MG PO CAPS
300.0000 mg | ORAL_CAPSULE | Freq: Two times a day (BID) | ORAL | Status: DC
  Administered 2015-01-31: 300 mg via ORAL
  Filled 2015-01-31 (×2): qty 1

## 2015-01-31 MED ORDER — ROCURONIUM BROMIDE 100 MG/10ML IV SOLN
INTRAVENOUS | Status: DC | PRN
  Administered 2015-01-31: 50 mg via INTRAVENOUS

## 2015-01-31 MED ORDER — PANTOPRAZOLE SODIUM 40 MG IV SOLR
40.0000 mg | Freq: Every day | INTRAVENOUS | Status: DC
Start: 2015-01-31 — End: 2015-01-31

## 2015-01-31 MED ORDER — ONDANSETRON HCL 4 MG/2ML IJ SOLN
INTRAMUSCULAR | Status: DC | PRN
  Administered 2015-01-31 (×2): 4 mg via INTRAVENOUS

## 2015-01-31 MED ORDER — METHOCARBAMOL 500 MG PO TABS
ORAL_TABLET | ORAL | Status: AC
  Filled 2015-01-31: qty 1

## 2015-01-31 MED ORDER — NITROGLYCERIN 0.4 MG SL SUBL: 0.4000 mg | SUBLINGUAL_TABLET | SUBLINGUAL | Status: DC | PRN

## 2015-01-31 MED ORDER — BUPIVACAINE LIPOSOME 1.3 % IJ SUSP
INTRAMUSCULAR | Status: DC | PRN
  Administered 2015-01-31: 20 mL

## 2015-01-31 MED ORDER — OXYCODONE-ACETAMINOPHEN 5-325 MG PO TABS
ORAL_TABLET | ORAL | Status: AC
  Filled 2015-01-31: qty 2

## 2015-01-31 MED ORDER — ABACAVIR-DOLUTEGRAVIR-LAMIVUD 600-50-300 MG PO TABS: 1.0000 | ORAL_TABLET | Freq: Every day | ORAL | Status: DC

## 2015-01-31 MED ORDER — PHENOL 1.4 % MT LIQD: 1.0000 | OROMUCOSAL | Status: DC | PRN

## 2015-01-31 MED ORDER — LAMIVUDINE 150 MG PO TABS
300.0000 mg | ORAL_TABLET | Freq: Every day | ORAL | Status: DC
  Administered 2015-01-31: 300 mg via ORAL
  Filled 2015-01-31 (×2): qty 2

## 2015-01-31 MED ORDER — THROMBIN 5000 UNITS EX SOLR
CUTANEOUS | Status: DC | PRN
  Administered 2015-01-31: 15:00:00 via TOPICAL

## 2015-01-31 MED ORDER — ROCURONIUM BROMIDE 50 MG/5ML IV SOLN
INTRAVENOUS | Status: AC
  Filled 2015-01-31: qty 1

## 2015-01-31 MED ORDER — THROMBIN 20000 UNITS EX SOLR
CUTANEOUS | Status: DC | PRN
  Administered 2015-01-31: 15:00:00 via TOPICAL

## 2015-01-31 MED ORDER — FLEET ENEMA 7-19 GM/118ML RE ENEM: 1.0000 | ENEMA | Freq: Once | RECTAL | Status: DC | PRN

## 2015-01-31 MED ORDER — SODIUM CHLORIDE 0.9 % IR SOLN
Status: DC | PRN
  Administered 2015-01-31: 15:00:00

## 2015-01-31 MED ORDER — CEFAZOLIN SODIUM-DEXTROSE 2-3 GM-% IV SOLR
INTRAVENOUS | Status: DC | PRN
  Administered 2015-01-31: 2 g via INTRAVENOUS

## 2015-01-31 MED ORDER — DEXAMETHASONE SODIUM PHOSPHATE 10 MG/ML IJ SOLN
INTRAMUSCULAR | Status: DC | PRN
  Administered 2015-01-31: 10 mg via INTRAVENOUS

## 2015-01-31 MED ORDER — PROPOFOL 10 MG/ML IV BOLUS
INTRAVENOUS | Status: AC
  Filled 2015-01-31: qty 20

## 2015-01-31 MED ORDER — FENTANYL CITRATE (PF) 100 MCG/2ML IJ SOLN
INTRAMUSCULAR | Status: DC | PRN
  Administered 2015-01-31: 50 ug via INTRAVENOUS
  Administered 2015-01-31 (×2): 100 ug via INTRAVENOUS
  Administered 2015-01-31 (×5): 50 ug via INTRAVENOUS

## 2015-01-31 MED ORDER — GLYCOPYRROLATE 0.2 MG/ML IJ SOLN
INTRAMUSCULAR | Status: AC
  Filled 2015-01-31: qty 3

## 2015-01-31 MED ORDER — OXYCODONE-ACETAMINOPHEN 5-325 MG PO TABS
1.0000 | ORAL_TABLET | ORAL | Status: DC | PRN
  Administered 2015-01-31 – 2015-02-01 (×4): 2 via ORAL
  Filled 2015-01-31 (×3): qty 2

## 2015-01-31 MED ORDER — SENNA 8.6 MG PO TABS
1.0000 | ORAL_TABLET | Freq: Two times a day (BID) | ORAL | Status: DC
  Administered 2015-01-31: 8.6 mg via ORAL
  Filled 2015-01-31 (×2): qty 1

## 2015-01-31 MED ORDER — LISINOPRIL 20 MG PO TABS
40.0000 mg | ORAL_TABLET | Freq: Every day | ORAL | Status: DC
Start: 2015-01-31 — End: 2015-02-01
  Administered 2015-01-31: 40 mg via ORAL
  Filled 2015-01-31 (×2): qty 2

## 2015-01-31 MED ORDER — METHOCARBAMOL 1000 MG/10ML IJ SOLN: 500.0000 mg | Freq: Four times a day (QID) | INTRAVENOUS | Status: DC | PRN

## 2015-01-31 MED ORDER — PANTOPRAZOLE SODIUM 40 MG PO TBEC
40.0000 mg | DELAYED_RELEASE_TABLET | Freq: Every day | ORAL | Status: DC
  Administered 2015-01-31: 40 mg via ORAL
  Filled 2015-01-31 (×2): qty 1

## 2015-01-31 MED ORDER — DEXAMETHASONE SODIUM PHOSPHATE 10 MG/ML IJ SOLN
INTRAMUSCULAR | Status: AC
  Filled 2015-01-31: qty 1

## 2015-01-31 MED ORDER — ONDANSETRON HCL 4 MG/2ML IJ SOLN
4.0000 mg | INTRAMUSCULAR | Status: DC | PRN
  Administered 2015-01-31: 4 mg via INTRAVENOUS
  Filled 2015-01-31: qty 2

## 2015-01-31 MED ORDER — ATORVASTATIN CALCIUM 20 MG PO TABS
40.0000 mg | ORAL_TABLET | Freq: Every day | ORAL | Status: DC
Start: 2015-01-31 — End: 2015-02-01
  Administered 2015-01-31: 40 mg via ORAL
  Filled 2015-01-31 (×2): qty 2

## 2015-01-31 MED ORDER — ABACAVIR SULFATE 300 MG PO TABS
600.0000 mg | ORAL_TABLET | Freq: Every day | ORAL | Status: DC
  Administered 2015-01-31: 600 mg via ORAL
  Filled 2015-01-31 (×2): qty 2

## 2015-01-31 MED ORDER — LACTATED RINGERS IV SOLN
INTRAVENOUS | Status: DC
  Administered 2015-01-31: 11:00:00 via INTRAVENOUS

## 2015-01-31 MED ORDER — BUPIVACAINE LIPOSOME 1.3 % IJ SUSP
20.0000 mL | Freq: Once | INTRAMUSCULAR | Status: DC
Start: 2015-01-31 — End: 2015-01-31
  Filled 2015-01-31: qty 20

## 2015-01-31 MED ORDER — DOLUTEGRAVIR SODIUM 50 MG PO TABS
50.0000 mg | ORAL_TABLET | Freq: Every day | ORAL | Status: DC
  Administered 2015-01-31: 50 mg via ORAL
  Filled 2015-01-31 (×2): qty 1

## 2015-01-31 MED ORDER — GLYCOPYRROLATE 0.2 MG/ML IJ SOLN
INTRAMUSCULAR | Status: DC | PRN
  Administered 2015-01-31: .6 mg via INTRAVENOUS

## 2015-01-31 MED ORDER — CEFAZOLIN SODIUM-DEXTROSE 2-3 GM-% IV SOLR
INTRAVENOUS | Status: AC
  Filled 2015-01-31: qty 50

## 2015-01-31 MED ORDER — DEXAMETHASONE SODIUM PHOSPHATE 4 MG/ML IJ SOLN
INTRAMUSCULAR | Status: AC
  Filled 2015-01-31: qty 2

## 2015-01-31 MED ORDER — PHENYLEPHRINE HCL 10 MG/ML IJ SOLN
10.0000 mg | INTRAVENOUS | Status: DC | PRN
  Administered 2015-01-31: 25 ug/min via INTRAVENOUS

## 2015-01-31 MED ORDER — MENTHOL 3 MG MT LOZG: 1.0000 | LOZENGE | OROMUCOSAL | Status: DC | PRN

## 2015-01-31 MED ORDER — SODIUM CHLORIDE 0.9 % IJ SOLN: 3.0000 mL | INTRAMUSCULAR | Status: DC | PRN

## 2015-01-31 MED ORDER — ROCURONIUM BROMIDE 50 MG/5ML IV SOLN
INTRAVENOUS | Status: AC
  Filled 2015-01-31: qty 2

## 2015-01-31 MED ORDER — SODIUM CHLORIDE 0.9 % IV SOLN: INTRAVENOUS | Status: DC

## 2015-01-31 MED ORDER — NEOSTIGMINE METHYLSULFATE 10 MG/10ML IV SOLN
INTRAVENOUS | Status: DC | PRN
  Administered 2015-01-31: 3 mg via INTRAVENOUS

## 2015-01-31 MED ORDER — TERBINAFINE HCL 250 MG PO TABS
250.0000 mg | ORAL_TABLET | Freq: Every day | ORAL | Status: DC
  Administered 2015-01-31: 250 mg via ORAL
  Filled 2015-01-31 (×2): qty 1

## 2015-01-31 MED ORDER — ACETAMINOPHEN 650 MG RE SUPP: 650.0000 mg | RECTAL | Status: DC | PRN

## 2015-01-31 MED ORDER — VANCOMYCIN HCL 1000 MG IV SOLR
INTRAVENOUS | Status: DC | PRN
  Administered 2015-01-31: 1000 mg via TOPICAL

## 2015-01-31 MED ORDER — METHOCARBAMOL 500 MG PO TABS
500.0000 mg | ORAL_TABLET | Freq: Four times a day (QID) | ORAL | Status: DC | PRN
  Administered 2015-01-31 – 2015-02-01 (×2): 500 mg via ORAL
  Filled 2015-01-31: qty 1

## 2015-01-31 MED ORDER — ASPIRIN 81 MG PO CHEW
81.0000 mg | CHEWABLE_TABLET | Freq: Every day | ORAL | Status: DC
  Administered 2015-01-31: 81 mg via ORAL
  Filled 2015-01-31 (×2): qty 1

## 2015-01-31 MED ORDER — CEFAZOLIN SODIUM-DEXTROSE 2-3 GM-% IV SOLR
2.0000 g | Freq: Three times a day (TID) | INTRAVENOUS | Status: AC
  Administered 2015-01-31 – 2015-02-01 (×2): 2 g via INTRAVENOUS
  Filled 2015-01-31 (×2): qty 50

## 2015-01-31 MED ORDER — SODIUM CHLORIDE 0.9 % IJ SOLN: 3.0000 mL | Freq: Two times a day (BID) | INTRAMUSCULAR | Status: DC

## 2015-01-31 MED ORDER — LACTATED RINGERS IV SOLN
INTRAVENOUS | Status: DC | PRN
  Administered 2015-01-31 (×3): via INTRAVENOUS

## 2015-01-31 MED ORDER — MIDAZOLAM HCL 2 MG/2ML IJ SOLN
INTRAMUSCULAR | Status: AC
  Filled 2015-01-31: qty 4

## 2015-01-31 MED ORDER — PROPOFOL 10 MG/ML IV BOLUS
INTRAVENOUS | Status: DC | PRN
Start: 2015-01-31 — End: 2015-01-31
  Administered 2015-01-31: 200 mg via INTRAVENOUS

## 2015-01-31 MED ORDER — ARTIFICIAL TEARS OP OINT
TOPICAL_OINTMENT | OPHTHALMIC | Status: DC | PRN
  Administered 2015-01-31: 1 via OPHTHALMIC

## 2015-01-31 SURGICAL SUPPLY — 75 items
BENZOIN TINCTURE PRP APPL 2/3 (GAUZE/BANDAGES/DRESSINGS) ×6 IMPLANT
BIT DRILL NEURO 2X3.1 SFT TUCH (MISCELLANEOUS) ×1 IMPLANT
BLADE CLIPPER SURG (BLADE) IMPLANT
BLADE SURG 11 STRL SS (BLADE) ×3 IMPLANT
BONE MATRIX OSTEOCEL PRO MED (Bone Implant) ×3 IMPLANT
BUR MATCHSTICK NEURO 3.0 LAGG (BURR) ×3 IMPLANT
BUR ROUND FLUTED 5 RND (BURR) ×2 IMPLANT
BUR ROUND FLUTED 5MM RND (BURR) ×1
CAGE PLIF 8X9X23-12 LUMBAR (Cage) ×6 IMPLANT
CANISTER SUCT 3000ML PPV (MISCELLANEOUS) ×6 IMPLANT
CHLORAPREP W/TINT 26ML (MISCELLANEOUS) ×3 IMPLANT
CONT SPEC 4OZ CLIKSEAL STRL BL (MISCELLANEOUS) ×3 IMPLANT
DECANTER SPIKE VIAL GLASS SM (MISCELLANEOUS) ×3 IMPLANT
DERMABOND ADHESIVE PROPEN (GAUZE/BANDAGES/DRESSINGS) ×4
DERMABOND ADVANCED (GAUZE/BANDAGES/DRESSINGS) ×2
DERMABOND ADVANCED .7 DNX12 (GAUZE/BANDAGES/DRESSINGS) ×1 IMPLANT
DERMABOND ADVANCED .7 DNX6 (GAUZE/BANDAGES/DRESSINGS) ×2 IMPLANT
DEVICE THRD PEDIGUARD 2.5MM XS (MISCELLANEOUS) ×1 IMPLANT
DRAPE C-ARM 42X72 X-RAY (DRAPES) ×3 IMPLANT
DRAPE C-ARMOR (DRAPES) ×3 IMPLANT
DRAPE MICROSCOPE LEICA (MISCELLANEOUS) IMPLANT
DRAPE POUCH INSTRU U-SHP 10X18 (DRAPES) ×3 IMPLANT
DRAPE SURG 17X23 STRL (DRAPES) ×3 IMPLANT
DRILL NEURO 2X3.1 SOFT TOUCH (MISCELLANEOUS) ×3
DRSG OPSITE 4X5.5 SM (GAUZE/BANDAGES/DRESSINGS) ×3 IMPLANT
ELECT REM PT RETURN 9FT ADLT (ELECTROSURGICAL) ×3
ELECTRODE REM PT RTRN 9FT ADLT (ELECTROSURGICAL) ×1 IMPLANT
EVACUATOR 1/8 PVC DRAIN (DRAIN) ×3 IMPLANT
GAUZE SPONGE 4X4 12PLY STRL (GAUZE/BANDAGES/DRESSINGS) IMPLANT
GAUZE SPONGE 4X4 16PLY XRAY LF (GAUZE/BANDAGES/DRESSINGS) IMPLANT
GLOVE EXAM NITRILE LRG STRL (GLOVE) IMPLANT
GLOVE EXAM NITRILE MD LF STRL (GLOVE) IMPLANT
GLOVE EXAM NITRILE XL STR (GLOVE) IMPLANT
GLOVE EXAM NITRILE XS STR PU (GLOVE) IMPLANT
GLOVE SS BIOGEL STRL SZ 7 (GLOVE) ×3 IMPLANT
GLOVE SUPERSENSE BIOGEL SZ 7 (GLOVE) ×6
GLOVE SURG SS PI 7.5 STRL IVOR (GLOVE) ×6 IMPLANT
GOWN STRL REUS W/ TWL LRG LVL3 (GOWN DISPOSABLE) ×2 IMPLANT
GOWN STRL REUS W/ TWL XL LVL3 (GOWN DISPOSABLE) IMPLANT
GOWN STRL REUS W/TWL LRG LVL3 (GOWN DISPOSABLE) ×4
GOWN STRL REUS W/TWL XL LVL3 (GOWN DISPOSABLE)
HEMOSTAT POWDER KIT SURGIFOAM (HEMOSTASIS) ×3 IMPLANT
KIT BASIN OR (CUSTOM PROCEDURE TRAY) ×3 IMPLANT
KIT ROOM TURNOVER OR (KITS) ×3 IMPLANT
LIGHT SOURCE ANGLE TIP STR 7FT (MISCELLANEOUS) ×3 IMPLANT
NEEDLE HYPO 21X1.5 SAFETY (NEEDLE) ×3 IMPLANT
NEEDLE HYPO 25X1 1.5 SAFETY (NEEDLE) ×3 IMPLANT
NS IRRIG 1000ML POUR BTL (IV SOLUTION) ×3 IMPLANT
PACK LAMINECTOMY NEURO (CUSTOM PROCEDURE TRAY) ×3 IMPLANT
PACK UNIVERSAL I (CUSTOM PROCEDURE TRAY) ×3 IMPLANT
PAD ARMBOARD 7.5X6 YLW CONV (MISCELLANEOUS) ×9 IMPLANT
PATTIES SURGICAL .5X1.5 (GAUZE/BANDAGES/DRESSINGS) ×3 IMPLANT
PEDIGUARD CLASSIC 2.5MM XS (MISCELLANEOUS) ×3
ROD 5.5X40MM (Rod) ×3 IMPLANT
ROD PREBENT 45MM LUMBAR (Rod) ×3 IMPLANT
RUBBERBAND STERILE (MISCELLANEOUS) IMPLANT
SCREW LOCK (Screw) ×8 IMPLANT
SCREW LOCK FXNS SPNE MAS PL (Screw) ×4 IMPLANT
SCREW SHANK 6.5X40 (Screw) ×4 IMPLANT
SCREW SHANK 6.5X40 NS LF (Screw) ×2 IMPLANT
SCREW SHANK 6.5X45 (Screw) ×3 IMPLANT
SCREW SHANK 6.5X65 (Screw) ×3 IMPLANT
SCREW TULIP 5.5 (Screw) ×12 IMPLANT
SPONGE NEURO XRAY DETECT 1X3 (DISPOSABLE) ×3 IMPLANT
SPONGE SURGIFOAM ABS GEL 100 (HEMOSTASIS) ×3 IMPLANT
SUT VIC AB 0 CT1 18XCR BRD8 (SUTURE) ×2 IMPLANT
SUT VIC AB 0 CT1 8-18 (SUTURE) ×4
SUT VIC AB 2-0 CT1 18 (SUTURE) ×6 IMPLANT
SUT VIC AB 3-0 SH 8-18 (SUTURE) ×6 IMPLANT
SYR 20CC LL (SYRINGE) ×3 IMPLANT
TOWEL OR 17X24 6PK STRL BLUE (TOWEL DISPOSABLE) ×3 IMPLANT
TOWEL OR 17X26 10 PK STRL BLUE (TOWEL DISPOSABLE) ×3 IMPLANT
TUBE CONNECTING 12'X1/4 (SUCTIONS) ×1
TUBE CONNECTING 12X1/4 (SUCTIONS) ×2 IMPLANT
WATER STERILE IRR 1000ML POUR (IV SOLUTION) ×3 IMPLANT

## 2015-01-31 NOTE — Progress Notes (Signed)
Awake but still disoriented from anesthesia Dorsiflexes and plantarflexes with full strength bilaterally Doing well

## 2015-01-31 NOTE — Care Management Note (Signed)
Case Management Note  Patient Details  Name: ARLI BREE MRN: [id number] Date of Birth: 10-07-1950  Subjective/Objective:                  Posterior lumbar interbody fusion L4-5   Action/Plan: Pt underwent surgery on 01/31/15 therefore discharge disposition is pending. PT eval pending. Cm will continue to assess for further discharge planning needs.   Expected Discharge Date:                  Expected Discharge Plan:  Home w Home Health Services  In-House Referral:     Discharge planning Services  CM Consult  Post Acute Care Choice:    Choice offered to:     DME Arranged:    DME Agency:     HH Arranged:    HH Agency:     Status of Service:  In process, will continue to follow  Medicare Important Message Given:    Date Medicare IM Given:    Medicare IM give by:    Date Additional Medicare IM Given:    Additional Medicare Important Message give by:     If discussed at Long Length of Stay Meetings, dates discussed:    Additional Comments:  Darcel Smalling, RN 01/31/2015, 8:34 PM

## 2015-01-31 NOTE — Progress Notes (Signed)
No changes Complains of bilateral leg pain 5/5 BLE Ready for surgery Questions answered

## 2015-01-31 NOTE — Anesthesia Preprocedure Evaluation (Addendum)
Anesthesia Evaluation  Patient identified by MRN, date of birth, ID band Patient awake    Reviewed: Allergy & Precautions, NPO status , Patient's Chart, lab work & pertinent test results  Airway Mallampati: I  TM Distance: >3 FB Neck ROM: Full    Dental   Pulmonary asthma , former smoker,    Pulmonary exam normal        Cardiovascular hypertension, Pt. on medications + CAD  Normal cardiovascular exam     Neuro/Psych    GI/Hepatic   Endo/Other  diabetes, Type 2, Oral Hypoglycemic Agents  Renal/GU      Musculoskeletal   Abdominal   Peds  Hematology   Anesthesia Other Findings   Reproductive/Obstetrics                            Anesthesia Physical Anesthesia Plan  ASA: III  Anesthesia Plan: General   Post-op Pain Management:    Induction:   Airway Management Planned: Oral ETT  Additional Equipment:   Intra-op Plan:   Post-operative Plan: Extubation in OR  Informed Consent: I have reviewed the patients History and Physical, chart, labs and discussed the procedure including the risks, benefits and alternatives for the proposed anesthesia with the patient or authorized representative who has indicated his/her understanding and acceptance.     Plan Discussed with: CRNA and Surgeon  Anesthesia Plan Comments:        Anesthesia Quick Evaluation

## 2015-01-31 NOTE — H&P (Signed)
This 64 year old female presents for back pain.  History of Present Illness: 1.  back pain  I saw Philmore Pali today in clinic.  She complains of low-back pain radiating to the backs of her thighs and calves that has been going on for 6 months.  It started gradually and has progressed.  It is relieved by rest and made worse by physical activity, especially walking.  She denies weakness or numbness in her lower extremities.  She states she has chronic constipation but denies any other bowel or bladder problems.  She has tried physical therapy and done a home exercise program as prescribed by them but this only made her pain worse.  She does not take any narcotic analgesics.  She is unable to take nonsteroidal anti-inflammatory medications.        PAST MEDICAL/SURGICAL HISTORY   (Detailed)  Disease/disorder Onset Date Management Date Comments    CABG    Asthma    RRM 01/17/2015 -  Coronary artery disease      Diabetes mellitus    RRM 01/17/2015 -  HIV    RRM 01/17/2015 -  Hyperlipidemia    RRM 01/17/2015 -  Hypertension         PAST MEDICAL HISTORY, SURGICAL HISTORY, FAMILY HISTORY, SOCIAL HISTORY AND REVIEW OF SYSTEMS I have reviewed the patient's past medical, surgical, family and social history as well as the comprehensive review of systems as included on the [facility] history form dated 01/17/2015, which I have signed.  Family History  (Detailed) Relationship Family Member Name Deceased Age at Death Condition Onset Age Cause of Death  Father    Hypertension  N  Mother    Hypertension  N  Mother    Diabetes mellitus  N    SOCIAL HISTORY  (Detailed) Tobacco use reviewed. Preferred language is Unknown.   Smoking status: Never smoker.  SMOKING STATUS Use Status Type Smoking Status Usage Per Day Years Used Total Pack Years  no/never  Never smoker       HOME ENVIRONMENT/SAFETY  The Patient has fallen 5 times in the last year.         MEDICATIONS(added, continued or stopped this visit): Started Medication Directions Instruction Stopped   amlodipine 10 mg tablet take 1 tablet by oral route  every day     Aspir-81 81 mg tablet,delayed release take 1 tablet by oral route  every day     Catapres 0.1 mg tablet take 1 tablet by oral route 2 times every day     Coreg CR 40 mg capsule, extended release take 1 capsule by oral route  every day     Effient 10 mg tablet take 1 tablet by oral route  every day     gabapentin 300 mg capsule take 1 capsule by oral route 3 times every day begin with 1 tablet 2 times a day for 3 days, and then take 1 tablet 3 times a day     isosorbide mononitrate ER 60 mg tablet,extended release 24 hr take 1 tablet by oral route  every day in the morning     Lipitor 40 mg tablet take 1 tablet by oral route  every day     lisinopril 40 mg tablet take 1 tablet by oral route  every day     metformin 500 mg tablet take 1 tablet by oral route 2 times every day with morning and evening meals     ProAir HFA 90  mcg/actuation aerosol inhaler inhale 2 puff by inhalation route  every 4 - 6 hours as needed     Protonix 40 mg tablet,delayed release take 1 tablet by oral route  every day     Triumeq 600 mg-50 mg-300 mg tablet take 1 tablet by oral route  every day       ALLERGIES: Ingredient Reaction Medication Name Comment  ROSUVASTATIN CALCIUM  Crestor    Reviewed, updated.    Vitals Date Temp F BP Pulse Ht In Wt Lb BMI BSA Pain Score  01/17/2015  131/86 83 68 209 31.78  8/10     PHYSICAL EXAM General Level of Distress: no acute distress Overall Appearance: normal    Musculoskeletal Gait and Station: normal  Right Left Lower Extremity Muscle Strength: normal normal Lower Extremity Muscle Tone: normal normal  Motor Strength Lower extremity motor strength was tested in the clinically pertinent muscles.     Deep Tendon  Reflexes  Right Left Patellar: normal normal Achilles: normal normal  Sensory Sensation was tested at L1 to S1.   Motor and other Tests    Right Left Clonus: normal normal     DIAGNOSTIC RESULTS I independently reviewed a noncontrasted lumbar MR study dated January 2015.  She has well maintained lumbar lordosis and her spinal alignment is normal except at L4 L5 there is a grade 2 spondylolisthesis.  There is a disc herniation associated with the spondylolisthesis.  It causes significant central neuroforaminal stenosis.  There is no other meaningful stenosis.    IMPRESSION Katie Mccarthy a 64 year old [place] American woman with lumbar spondylolisthesis and radiculopathy.  She is neurologically intact to objective testing.  She has not responded well to conservative management.  I think she would benefit from an L4 L5 posterior lumbar interbody fusion.We had a long discussion, in language she understands, about the details of the procedure and its risks and benefits.  We also discussed alternatives to the procedure and the risks and benefits of those.  She asked appropriate questions and wishes to proceed with surgery.  Given that her MRI is 55-1/63 years old I want to repeat an MRI to make sure she has not developed additional pathology.  I will review this and let her know if we're going to proceed with the current plan.  If her imaging is unchanged and the plan will be an L4 L5 PLIF on September 27.   Completed Orders (this encounter) Order Details Reason Side Interpretation Result Initial Treatment Date Region  Dietary management education, guidance, and counseling Encouraged to eat a well balanced diet and follow up with primary care physician.         Assessment/Plan # Detail Type Description   1. Assessment Spondylolisthesis, lumbosacral region (M43.17).       2. Assessment Lumbar radiculopathy (M54.16).       3. Assessment Body mass index (BMI) 31.0-31.9, adult (Z68.31).   Plan  Orders Today's instructions / counseling include(s) Dietary management education, guidance, and counseling.         Pain Assessment/Treatment Pain Scale: 8/10. Method: Numeric Pain Intensity Scale. Onset: 12/04/2013.  Fall Risk Plan The Patient has fallen 5 times in the last year.   Lumbar MRI without contrast, L4 L5 PLIF

## 2015-01-31 NOTE — Op Note (Signed)
PRE-OPERATIVE DIAGNOSIS: Spondylolisthesis L4-5, lumbar spinal stenosis L4-5, lumbar radiculopathy  POST-OPERATIVE DIAGNOSIS: Same  PROCEDURE: Posterior lumbar interbody fusion L4-5 with autograft and morcellized allograft, dorsal internal fixation with cortical trajectory screws bilaterally at L4 and left L5, traditional trajectory pedicle screw at right L5  SURGEON: Cherrie Distance, MD  ASSISTANTS: Lisbeth Renshaw, MD  ANESTHESIA: General  DRAINS: Medium Hemovac  INDICATION FOR PROCEDURE: Patient with back and leg pain. She has lumbar spinal stenosis and spondylolisthesis at L4-5. She's failed conservative management. Patient understood the risks, benefits, and alternatives and potential outcomes and wished to proceed.  PROCEDURE DETAILS: After smooth induction of general endotracheal anesthesia the patient was turned prone on the Oxbow table. The skin of the lumbar area was clipped of hair and wiped out with alcohol. It was prepped with Betadine scrub and chlorhexidine. Sterile drapes were applied. The planned incision was injected with a mixture of lidocaine and Marcaine with epinephrine.  The skin was opened sharply and a subperiosteal dissection was performed to expose the lateral edges of the lamina from L4-L5. A Nuvasive MAS PLIF retractor was inserted into the field and opened. Using AP and lateral fluoroscopy cortical trajectory screws were placed at L4 and L5. A wide decompression was performed at L4-5. The exiting L4, L5, and S1 nerve roots were identified and found to to be adequately decompressed at this point. However, a medial breach of the pedicle at L5 on the right was identified and this screw was removed and a traditional trajectory pedicle screw was placed.  The thecal sac was medialized at L4-5 an annulotomy was made. Using sequentially larger disc shavers this space was expanded. Disc material was removed with shavers, curettes, and the bone was decorticated with  a rasp. The interbody space was packed with a combination morcellized allograft and autograft and two 11.5 mm peek spacers with 12 of lordosis were inserted into the interbody space.  A lordotic rod was inserted and secured in the screw caps. Final tightening with a torque wrench was performed at all levels. Meticulous hemostasis was obtained. The wound was irrigated with bacitracin saline. A medium Hemovac drain was placed below the fascia. About 500 mg of vancomycin powder was inserted into the wound. The wound was closed in layers with interrupted Vicryl sutures. Dermabond was used on the skin.  The patient was turned supine position. She was able to extubated without difficulty.  Delay start of Pharmacological VTE agent (>24hrs) due to surgical blood loss or risk of bleeding: Yes

## 2015-01-31 NOTE — Progress Notes (Signed)
Pt admits to taking one nitroglycerin tablet 2 days ago for chest pain.  States that took care of it.  Dr Michelle Piper informed and he states he will evaluate her when she gets upstairs.

## 2015-01-31 NOTE — Transfer of Care (Signed)
Immediate Anesthesia Transfer of Care Note  Patient: Katie Mccarthy  Procedure(s) Performed: Procedure(s) with comments: Lumbar four to five Decompression and Posterior Lumbar Interbody Fusion (N/A) - L4-5 MAXIMUM ACCESS POSTERIOR LUMBAR INTERBODY FUSION  Patient Location: PACU  Anesthesia Type:General  Level of Consciousness: awake, alert  and oriented  Airway & Oxygen Therapy: Patient Spontanous Breathing and Patient connected to nasal cannula oxygen  Post-op Assessment: Report given to RN and Post -op Vital signs reviewed and stable  Post vital signs: Reviewed and stable  Last Vitals:  Filed Vitals:   01/31/15 1050  BP: 155/85  Pulse: 78  Temp:   Resp:     Complications: No apparent anesthesia complications

## 2015-02-01 ENCOUNTER — Encounter (HOSPITAL_COMMUNITY): Payer: Self-pay | Admitting: Neurological Surgery

## 2015-02-01 LAB — BASIC METABOLIC PANEL
ANION GAP: 9 (ref 5–15)
BUN: 15 mg/dL (ref 6–20)
CO2: 23 mmol/L (ref 22–32)
Calcium: 8.9 mg/dL (ref 8.9–10.3)
Chloride: 102 mmol/L (ref 101–111)
Creatinine, Ser: 1.1 mg/dL — ABNORMAL HIGH (ref 0.44–1.00)
GFR calc Af Amer: >60 mL/min (ref >60)
GFR, EST NON AFRICAN AMERICAN: 52 mL/min — AB (ref >60)
GLUCOSE: 124 mg/dL — AB (ref 65–99)
POTASSIUM: 4.9 mmol/L (ref 3.5–5.1)
Sodium: 134 mmol/L — ABNORMAL LOW (ref 135–145)

## 2015-02-01 LAB — CBC
HCT: 34.6 % — ABNORMAL LOW (ref 36.0–46.0)
Hemoglobin: 11.2 g/dL — ABNORMAL LOW (ref 12.0–15.0)
MCH: 27.5 pg (ref 26.0–34.0)
MCHC: 32.4 g/dL (ref 30.0–36.0)
MCV: 85 fL (ref 78.0–100.0)
PLATELETS: 281 10*3/uL (ref 150–400)
RBC: 4.07 MIL/uL (ref 3.87–5.11)
RDW: 14 % (ref 11.5–15.5)
WBC: 8.9 10*3/uL (ref 4.0–10.5)

## 2015-02-01 LAB — GLUCOSE, CAPILLARY: GLUCOSE-CAPILLARY: 103 mg/dL — AB (ref 65–99)

## 2015-02-01 MED ORDER — METHOCARBAMOL 500 MG PO TABS: 750.0000 mg | ORAL_TABLET | Freq: Four times a day (QID) | ORAL | Status: AC | PRN

## 2015-02-01 MED ORDER — OXYCODONE-ACETAMINOPHEN 5-325 MG PO TABS: 1.0000 | ORAL_TABLET | ORAL | Status: DC | PRN

## 2015-02-01 NOTE — Anesthesia Postprocedure Evaluation (Signed)
Anesthesia Post Note  Patient: Katie Mccarthy  Procedure(s) Performed: Procedure(s) (LRB): Lumbar four to five Decompression and Posterior Lumbar Interbody Fusion (N/A)  Anesthesia type: General  Patient location: PACU  Post pain: Pain level controlled  Post assessment: Post-op Vital signs reviewed  Last Vitals: BP 133/75 mmHg  Pulse 79  Temp(Src) 36.8 C (Oral)  Resp 20  Wt 208 lb (94.348 kg)  SpO2 96%  Post vital signs: Reviewed  Level of consciousness: sedated  Complications: No apparent anesthesia complications

## 2015-02-01 NOTE — Progress Notes (Signed)
No acute events.  States leg pain is 100% better.  Reports that she is ambulating well. AVSS Drain 130 Motor 5/5 BLE Incision c/d/i Stable/improved D/c

## 2015-02-01 NOTE — Progress Notes (Signed)
Pulled 3 percocets , meaning to pull 2 from pyxis and were opened after scanning ..... Mistake realized, to be discarded with witness later in and did not occur. Now , have med to waste and after speaking with Fleet Contras, CPT in [employer], staes to waste with witness Norva Riffle RN) and then resolve in pyxis/verify count in drawer and make not in pyxis , refer to this note if possible.

## 2015-02-01 NOTE — Evaluation (Signed)
Occupational Therapy Evaluation Patient Details Name: Katie Mccarthy MRN: [id number] DOB: May 08, 1950 Today's Date: 02/01/2015    History of Present Illness 64 yo female s/p L4-5 PLIF PMH: CAD (s/p CABG in 2012: LIMA to LAD, SVG to RCA, SVG to diagonal, SVG OM), HTN, DM, HIV. Former smoker.    Clinical Impression   Patient evaluated by Occupational Therapy with no further acute OT needs identified. All education has been completed and the patient has no further questions. See below for any follow-up Occupational Therapy or equipment needs. OT to sign off. Thank you for referral.      Follow Up Recommendations  No OT follow up    Equipment Recommendations  None recommended by OT    Recommendations for Other Services       Precautions / Restrictions Precautions Precautions: Back Precaution Comments: handout provided and reviewed Required Braces or Orthoses: Spinal Brace (ordered but not present at this time)      Mobility Bed Mobility Overal bed mobility: Needs Assistance Bed Mobility: Supine to Sit;Rolling Rolling: Supervision   Supine to sit: Supervision     General bed mobility comments: pt able to complete transfer without rails and min v/c  Transfers Overall transfer level: Needs assistance   Transfers: Sit to/from Stand Sit to Stand: Supervision              Balance Overall balance assessment: No apparent balance deficits (not formally assessed)                                          ADL Overall ADL's : Needs assistance/impaired Eating/Feeding: Independent   Grooming: Wash/dry hands;Wash/dry face;Modified independent;Standing   Upper Body Bathing: Modified independent;Sitting   Lower Body Bathing: Min guard;Sit to/from stand       Lower Body Dressing: Min guard;Bed level Lower Body Dressing Details (indicate cue type and reason): pt able to lay supien and cross bil LE. pt plans to have spouse (A) initially at home until she  can do it more without pain Toilet Transfer: Min guard;Ambulation           Functional mobility during ADLs: Min guard ("i think i can do it without it") General ADL Comments: Pt educated on ADLs with handout and back precautions. Pt with good return demo     Vision     Perception     Praxis      Pertinent Vitals/Pain Pain Assessment: 0-10 Pain Score: 6  Pain Location: back Pain Descriptors / Indicators: Aching Pain Intervention(s): Monitored during session;Repositioned     Hand Dominance Right   Extremity/Trunk Assessment Upper Extremity Assessment Upper Extremity Assessment: Overall WFL for tasks assessed   Lower Extremity Assessment Lower Extremity Assessment: Defer to PT evaluation   Cervical / Trunk Assessment Cervical / Trunk Assessment: Other exceptions (s/p surg)   Communication Communication Communication: No difficulties   Cognition Arousal/Alertness: Awake/alert Behavior During Therapy: WFL for tasks assessed/performed Overall Cognitive Status: Within Functional Limits for tasks assessed                     General Comments       Exercises       Shoulder Instructions      Home Living Family/patient expects to be discharged to:: Private residence Living Arrangements: Spouse/significant other;Children (x2 sons) Available Help at Discharge: Family Type of Home: Apartment (duplex)  Home Layout: Two level     Bathroom Shower/Tub: Tub/shower unit Shower/tub characteristics: Engineer, building services: Standard     Home Equipment: None          Prior Functioning/Environment Level of Independence: Needs assistance    ADL's / Homemaking Assistance Needed: spouse and children do all the household tasks        OT Diagnosis: Acute pain   OT Problem List:     OT Treatment/Interventions:      OT Goals(Current goals can be found in the care plan section) Acute Rehab OT Goals Patient Stated Goal: to get a formed pillow for  sleeping Potential to Achieve Goals: Good  OT Frequency:     Barriers to D/C:            Co-evaluation              End of Session Nurse Communication: Mobility status;Precautions  Activity Tolerance: Patient tolerated treatment well Patient left: in chair;with call bell/phone within reach   Time: 0702-0730 OT Time Calculation (min): 28 min Charges:  OT General Charges $OT Visit: 1 Procedure OT Evaluation $Initial OT Evaluation Tier I: 1 Procedure OT Treatments $Self Care/Home Management : 8-22 mins G-Codes:    Boone Master B Feb 27, 2015, 7:40 AM   Mateo Flow   OTR/L Pager: [phone] Office: [phone] .

## 2015-02-01 NOTE — Progress Notes (Signed)
Utilization review completed.  

## 2015-02-01 NOTE — Progress Notes (Signed)
Patient alert and oriented, mae's well, voiding adequate amount of urine, swallowing without difficulty, c/o mild pain. Patient discharged home with family. Script and discharged instructions given to patient. Patient and family stated understanding of d/c instructions given and has an appointment with MD.   

## 2015-02-01 NOTE — Evaluation (Signed)
Physical Therapy Evaluation/Discharge Patient Details Name: Katie Mccarthy MRN: [id number] DOB: 07/01/1950 Today's Date: 02/01/2015   History of Present Illness  64 yo female s/p L4-5 PLIF PMH: CAD (s/p CABG in 2012: LIMA to LAD, SVG to RCA, SVG to diagonal, SVG OM), HTN, DM, HIV. Former smoker.   Clinical Impression  Pt is POD #1 and moving well with PT.  She did not require a RW and back education completed and handout given.  Pt would benefit from 3-in-1 to elevate her toilet and provide leverage to get up.  PT will sign off as pt will have necessary level of assist at home to help her mobilize safely.     Follow Up Recommendations No PT follow up    Equipment Recommendations  3in1 (PT)    Recommendations for Other Services   NA    Precautions / Restrictions Precautions Precautions: Back Precaution Booklet Issued: Yes (comment) Precaution Comments: handout provided and reviewed Required Braces or Orthoses: Spinal Brace Spinal Brace: Lumbar corset;Applied in sitting position Restrictions Weight Bearing Restrictions: No      Mobility  Bed Mobility Overal bed mobility: Needs Assistance Bed Mobility: Supine to Sit;Rolling Rolling: Supervision   Supine to sit: Supervision     General bed mobility comments: Reinforced log roll technique both into and out of bed with pt.    Transfers Overall transfer level: Needs assistance Equipment used: None Transfers: Sit to/from Stand Sit to Stand: Supervision         General transfer comment: slow, heavy use of hands  Ambulation/Gait Ambulation/Gait assistance: Supervision Ambulation Distance (Feet): 250 Feet Assistive device: None Gait Pattern/deviations: Step-through pattern;Staggering right;Staggering left Gait velocity: decreased Gait velocity interpretation: Below normal speed for age/gender General Gait Details: occational staggering pattern.    Stairs Stairs: Yes Stairs assistance: Min guard Stair Management: One  rail Right;Alternating pattern;Step to pattern;Forwards;Backwards Number of Stairs: 18 General stair comments: Pt went up forwards alternating pattern, and down backwards step to pattern.  Pt usually goes down backwards at home and did not feel up to trying forwards with PT today (she attempted, but changed her mind).          Balance Overall balance assessment: Needs assistance Sitting-balance support: Feet supported;No upper extremity supported Sitting balance-Leahy Scale: Good     Standing balance support: No upper extremity supported Standing balance-Leahy Scale: Good                               Pertinent Vitals/Pain Pain Assessment: Faces Pain Score: 6  Faces Pain Scale: Hurts a little bit Pain Location: right low back Pain Descriptors / Indicators: Grimacing Pain Intervention(s): Limited activity within patient's tolerance;Monitored during session;Repositioned    Home Living Family/patient expects to be discharged to:: Private residence Living Arrangements: Spouse/significant other;Children Available Help at Discharge: Family Type of Home: Apartment (duplex/townhome) Home Access: Level entry     Home Layout: Two level Home Equipment: None      Prior Function Level of Independence: Needs assistance   Gait / Transfers Assistance Needed: independent with gait, but abnormal pattern  ADL's / Homemaking Assistance Needed: spouse and children do all the household tasks        Hand Dominance   Dominant Hand: Right    Extremity/Trunk Assessment   Upper Extremity Assessment: Defer to OT evaluation           Lower Extremity Assessment: Generalized weakness (per pt report PTA R leg  was worse pain/weakness than left)      Cervical / Trunk Assessment: Other exceptions  Communication   Communication: No difficulties  Cognition Arousal/Alertness: Awake/alert Behavior During Therapy: WFL for tasks assessed/performed Overall Cognitive Status:  Within Functional Limits for tasks assessed                      General Comments General comments (skin integrity, edema, etc.): hemovac intact          Assessment/Plan    PT Assessment Patent does not need any further PT services  PT Diagnosis Difficulty walking;Abnormality of gait;Generalized weakness;Acute pain         PT Goals (Current goals can be found in the Care Plan section) Acute Rehab PT Goals Patient Stated Goal: to get home and be better than she was before PT Goal Formulation: All assessment and education complete, DC therapy               End of Session Equipment Utilized During Treatment: Back brace Activity Tolerance: Patient limited by pain Patient left: in chair;with call bell/phone within reach;with family/visitor present Nurse Communication: Mobility status         Time: 1610-9604 PT Time Calculation (min) (ACUTE ONLY): 23 min   Charges:   PT Evaluation $Initial PT Evaluation Tier I: 1 Procedure PT Treatments $Gait Training: 8-22 mins        Rebecca B. Medendorp, PT, DPT [phone]   02/01/2015, 10:31 AM

## 2015-02-01 NOTE — Discharge Summary (Addendum)
Date of admission: 01/31/2015  Date of discharge: 02/01/2015  Admission diagnosis: Spondylolisthesis L4-5, lumbar radiculopathy next  Discharge diagnosis: Same  Procedure: L4-5 decompression and PLIF  Hospital course: Katie Mccarthy was admitted to the hospital on the morning surgery and taken to the operating room for the above listed operation. She tolerated this well. She was medically and neurologically stable on awakening in the PACU. She is able to ambulate on the floor and her catheter was removed and she was able to void without issue. On the morning of postoperative day 1 she remains medically and neurologically stable and she is discharged home at this time.  Discharge medications: She'll resume her prior medications and take Percocet for pain and Robaxin for muscle spasms. Advised to resume prasugrel on Friday.  Follow-up: In 2 weeks

## 2015-02-02 ENCOUNTER — Encounter (HOSPITAL_COMMUNITY): Payer: Self-pay | Admitting: Neurological Surgery

## 2015-02-20 ENCOUNTER — Other Ambulatory Visit: Payer: Self-pay | Admitting: Internal Medicine

## 2015-02-21 ENCOUNTER — Ambulatory Visit (INDEPENDENT_AMBULATORY_CARE_PROVIDER_SITE_OTHER): Payer: Medicare Other | Admitting: Internal Medicine

## 2015-02-21 ENCOUNTER — Encounter: Payer: Self-pay | Admitting: Internal Medicine

## 2015-02-21 VITALS — BP 133/85 | HR 76 | Temp 98.1°F | Wt 205.8 lb

## 2015-02-21 DIAGNOSIS — B20 Human immunodeficiency virus [HIV] disease: Secondary | ICD-10-CM

## 2015-02-21 DIAGNOSIS — K5909 Other constipation: Secondary | ICD-10-CM | POA: Diagnosis not present

## 2015-02-21 DIAGNOSIS — I10 Essential (primary) hypertension: Secondary | ICD-10-CM

## 2015-02-21 DIAGNOSIS — M549 Dorsalgia, unspecified: Secondary | ICD-10-CM | POA: Diagnosis not present

## 2015-02-21 DIAGNOSIS — G8929 Other chronic pain: Secondary | ICD-10-CM

## 2015-02-21 DIAGNOSIS — Z23 Encounter for immunization: Secondary | ICD-10-CM | POA: Diagnosis not present

## 2015-02-21 MED ORDER — OXYCODONE-ACETAMINOPHEN 5-325 MG PO TABS: 1.0000 | ORAL_TABLET | Freq: Three times a day (TID) | ORAL | Status: DC | PRN

## 2015-02-21 NOTE — Progress Notes (Signed)
Patient ID: Katie Mccarthy, female   DOB: November 12, 1950, 64 y.o.   MRN: [id number]       Patient ID: Katie Mccarthy, female   DOB: 1950/06/19, 64 y.o.   MRN: [id number]  HPI 64yo F with HIV disease, CD 4 count of 790/VL<20 on triumeq. Doing well with adherence. Since our last visit, she had L4-L5 PLIF on 9/28 to treat progressive lumbar radiculopathy. She is 3 weeks out from her surgery, slowly on the mend. She did run out of pain meds where she is feeling like   Outpatient Encounter Prescriptions as of 02/21/2015  Medication Sig  . albuterol (PROVENTIL HFA;VENTOLIN HFA) 108 (90 BASE) MCG/ACT inhaler Inhale into the lungs every 6 (six) hours as needed for wheezing or shortness of breath.  Marland Kitchen amLODipine (NORVASC) 10 MG tablet Take 10 mg by mouth daily.  Marland Kitchen aspirin 81 MG chewable tablet Chew 81 mg by mouth daily.   Marland Kitchen atorvastatin (LIPITOR) 40 MG tablet Take 40 mg by mouth daily.  . carvedilol (COREG) 12.5 MG tablet TAKE 1 TABLET BY MOUTH TWICE DAILY WITH A MEAL  . cloNIDine (CATAPRES) 0.1 MG tablet Take 0.1 mg by mouth 2 (two) times daily.  Marland Kitchen gabapentin (NEURONTIN) 300 MG capsule Take 1 capsule (300 mg total) by mouth 3 (three) times daily. Start with 1 tab daily x 5 days; then 1 tab twice/day x 5 days;then 3x/day (Patient taking differently: Take 300 mg by mouth 2 (two) times daily. Start with 1 tab daily x 5 days; then 1 tab twice/day x 5 days;then 3x/day)  . isosorbide mononitrate (IMDUR) 60 MG 24 hr tablet Take 60 mg by mouth daily.  Marland Kitchen lisinopril (PRINIVIL,ZESTRIL) 40 MG tablet TAKE 1 TABLET BY MOUTH EVERY DAY  . metFORMIN (GLUCOPHAGE) 500 MG tablet Take 500 mg by mouth daily with breakfast.  . methocarbamol (ROBAXIN) 500 MG tablet Take 1.5 tablets (750 mg total) by mouth every 6 (six) hours as needed for muscle spasms.  . nitroGLYCERIN (NITROSTAT) 0.4 MG SL tablet Place 0.4 mg under the tongue every 5 (five) minutes as needed for chest pain.  Marland Kitchen oxyCODONE-acetaminophen (PERCOCET/ROXICET) 5-325 MG tablet  Take 1-2 tablets by mouth every 4 (four) hours as needed for moderate pain.  . pantoprazole (PROTONIX) 40 MG tablet Take 1 tablet (40 mg total) by mouth daily.  . prasugrel (EFFIENT) 10 MG TABS tablet Take 10 mg by mouth daily.  Marland Kitchen terbinafine (LAMISIL) 250 MG tablet Take 250 mg by mouth daily.  . TRIUMEQ 600-50-300 MG TABS TAKE 1 TABLET BY MOUTH EVERY NIGHT AT BEDTIME   No facility-administered encounter medications on file as of 02/21/2015.     Patient Active Problem List   Diagnosis Date Noted  . HIV (human immunodeficiency virus infection) (HCC) 04/27/2012    Priority: High  . Hypertension 04/27/2012    Priority: Medium  . Diabetes (HCC) 04/27/2012    Priority: Medium  . Hypercholesteremia 04/27/2012    Priority: Medium  . Lumbar spondylosis 01/31/2015  . Chest pain 02/03/2014  . LSIL (low grade squamous intraepithelial lesion) on Pap smear 12/30/2012  . Gout 04/27/2012  . Asthma 04/27/2012  . CAD (coronary artery disease) 04/27/2012  . 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 FOOT EXAM  01/25/1962  . OPHTHALMOLOGY EXAM  01/25/1962  . URINE MICROALBUMIN  01/25/1962  . COLONOSCOPY  01/25/2002  . ZOSTAVAX  01/26/2012  . PNEUMOCOCCAL POLYSACCHARIDE VACCINE (2) 03/06/2014  . INFLUENZA VACCINE  12/05/2014     Review of Systems Positive pertinents listed in hpi, otherwise 10 point ros is negative Physical Exam   BP 133/85 mmHg  Pulse 76  Temp(Src) 98.1 F (36.7 C) (Oral)  Wt 205 lb 12 oz (93.328 kg) Physical Exam  Constitutional:  oriented to person, place, and time. appears well-developed and well-nourished. No distress.  HENT: [place]/AT, PERRLA, no scleral icterus Mouth/Throat: Oropharynx is clear and moist. No oropharyngeal exudate.  Cardiovascular: Normal rate, regular rhythm and normal heart sounds. Exam reveals no gallop and no friction rub.  No murmur heard.  Pulmonary/Chest: Effort normal and breath sounds normal. No respiratory  distress.  has no wheezes.  Neck = supple, no nuchal rigidity Abdominal: Soft. Bowel sounds are normal.  exhibits no distension. There is no tenderness.  Lymphadenopathy: no cervical adenopathy. No axillary adenopathy Neurological: alert and oriented to person, place, and time.  Skin: Skin is warm and dry. No rash noted. No erythema.  Psychiatric: a normal mood and affect.  behavior is normal.    Lab Results  Component Value Date   CD4TCELL 37 11/04/2014   Lab Results  Component Value Date   CD4TABS 790 11/04/2014   CD4TABS 680 03/08/2014   CD4TABS 720 12/15/2013   Lab Results  Component Value Date   HIV1RNAQUANT <20 11/04/2014   Lab Results  Component Value Date   HEPBSAB NEG 03/08/2014   No results found for: RPR  CBC Lab Results  Component Value Date   WBC 8.9 02/01/2015   RBC 4.07 02/01/2015   HGB 11.2* 02/01/2015   HCT 34.6* 02/01/2015   PLT 281 02/01/2015   MCV 85.0 02/01/2015   MCH 27.5 02/01/2015   MCHC 32.4 02/01/2015   RDW 14.0 02/01/2015   LYMPHSABS 2.3 08/15/2014   MONOABS 0.7 08/15/2014   EOSABS 0.2 08/15/2014   BASOSABS 0.0 08/15/2014   BMET Lab Results  Component Value Date   NA 134* 02/01/2015   K 4.9 02/01/2015   CL 102 02/01/2015   CO2 23 02/01/2015   GLUCOSE 124* 02/01/2015   BUN 15 02/01/2015   CREATININE 1.10* 02/01/2015   CALCIUM 8.9 02/01/2015   GFRNONAA 52* 02/01/2015   GFRAA >60 02/01/2015     Assessment and Plan   Back pain 2/2 surgery = will refill her pain meds with #60 tabs  Constipation = continue with stool softener and laxative  hiv = well controlled, continue with triumeq  htn = well controlled  Health maintenance = gave flu vac

## 2015-03-20 ENCOUNTER — Other Ambulatory Visit: Payer: Self-pay | Admitting: Internal Medicine

## 2015-04-01 ENCOUNTER — Emergency Department (HOSPITAL_COMMUNITY)
Admission: EM | Admit: 2015-04-01 | Discharge: 2015-04-01 | Disposition: A | Discharge status: Home / Self Care | Payer: Medicare Other | Attending: Emergency Medicine | Admitting: Emergency Medicine

## 2015-04-01 ENCOUNTER — Emergency Department (HOSPITAL_COMMUNITY): Payer: Medicare Other

## 2015-04-01 ENCOUNTER — Encounter (HOSPITAL_COMMUNITY): Payer: Self-pay

## 2015-04-01 DIAGNOSIS — I2510 Atherosclerotic heart disease of native coronary artery without angina pectoris: Secondary | ICD-10-CM | POA: Diagnosis not present

## 2015-04-01 DIAGNOSIS — E119 Type 2 diabetes mellitus without complications: Secondary | ICD-10-CM | POA: Insufficient documentation

## 2015-04-01 DIAGNOSIS — I10 Essential (primary) hypertension: Secondary | ICD-10-CM | POA: Insufficient documentation

## 2015-04-01 DIAGNOSIS — Z87891 Personal history of nicotine dependence: Secondary | ICD-10-CM | POA: Diagnosis not present

## 2015-04-01 DIAGNOSIS — Z7901 Long term (current) use of anticoagulants: Secondary | ICD-10-CM | POA: Insufficient documentation

## 2015-04-01 DIAGNOSIS — B20 Human immunodeficiency virus [HIV] disease: Secondary | ICD-10-CM | POA: Diagnosis not present

## 2015-04-01 DIAGNOSIS — Z7982 Long term (current) use of aspirin: Secondary | ICD-10-CM | POA: Insufficient documentation

## 2015-04-01 DIAGNOSIS — M5441 Lumbago with sciatica, right side: Secondary | ICD-10-CM | POA: Diagnosis not present

## 2015-04-01 DIAGNOSIS — Z79899 Other long term (current) drug therapy: Secondary | ICD-10-CM | POA: Insufficient documentation

## 2015-04-01 DIAGNOSIS — Z862 Personal history of diseases of the blood and blood-forming organs and certain disorders involving the immune mechanism: Secondary | ICD-10-CM | POA: Insufficient documentation

## 2015-04-01 DIAGNOSIS — Z7984 Long term (current) use of oral hypoglycemic drugs: Secondary | ICD-10-CM | POA: Insufficient documentation

## 2015-04-01 DIAGNOSIS — M545 Low back pain: Secondary | ICD-10-CM | POA: Diagnosis present

## 2015-04-01 LAB — URINALYSIS, ROUTINE W REFLEX MICROSCOPIC
BILIRUBIN URINE: NEGATIVE
Glucose, UA: NEGATIVE mg/dL
HGB URINE DIPSTICK: NEGATIVE
KETONES UR: 15 mg/dL — AB
Leukocytes, UA: NEGATIVE
NITRITE: NEGATIVE
PROTEIN: NEGATIVE mg/dL
SPECIFIC GRAVITY, URINE: 1.027 (ref 1.005–1.030)
pH: 5.5 (ref 5.0–8.0)

## 2015-04-01 LAB — I-STAT TROPONIN, ED: TROPONIN I, POC: 0.01 ng/mL (ref 0.00–0.08)

## 2015-04-01 MED ORDER — OXYCODONE-ACETAMINOPHEN 5-325 MG PO TABS
2.0000 | ORAL_TABLET | Freq: Once | ORAL | Status: AC
  Administered 2015-04-01: 2 via ORAL
  Filled 2015-04-01: qty 2

## 2015-04-01 NOTE — Discharge Instructions (Signed)
°  Sciatica °Sciatica is pain, weakness, numbness, or tingling along your sciatic nerve. The nerve starts in the lower back and runs down the back of each leg. Nerve damage or certain conditions pinch or put pressure on the sciatic nerve. This causes the pain, weakness, and other discomforts of sciatica. °HOME CARE  °· Only take medicine as told by your doctor. °· Apply ice to the affected area for 20 minutes. Do this 3-4 times a day for the first 48-72 hours. Then try heat in the same way. °· Exercise, stretch, or do your usual activities if these do not make your pain worse. °· Go to physical therapy as told by your doctor. °· Keep all doctor visits as told. °· Do not wear high heels or shoes that are not supportive. °· Get a firm mattress if your mattress is too soft to lessen pain and discomfort. °GET HELP RIGHT AWAY IF:  °· You cannot control when you poop (bowel movement) or pee (urinate). °· You have more weakness in your lower back, lower belly (pelvis), butt (buttocks), or legs. °· You have redness or puffiness (swelling) of your back. °· You have a burning feeling when you pee. °· You have pain that gets worse when you lie down. °· You have pain that wakes you from your sleep. °· Your pain is worse than past pain. °· Your pain lasts longer than 4 weeks. °· You are suddenly losing weight without reason. °MAKE SURE YOU:  °· Understand these instructions. °· Will watch this condition. °· Will get help right away if you are not doing well or get worse. °  °This information is not intended to replace advice given to you by your health care provider. Make sure you discuss any questions you have with your health care provider. °  °Document Released: 01/30/2008 Document Revised: 01/11/2015 Document Reviewed: 09/01/2011 °Elsevier Interactive Patient Education ©2016 [employer] ° ° °

## 2015-04-01 NOTE — ED Notes (Signed)
Per EMS - pt had back surgery in September (lumbar). Pt c/o worse pain in lumbar region x 2 days, radiates to chest. No cardiac s/s. No recent trauma. VSS. BP 128/88, rr 18, hr 84, NSR.

## 2015-04-01 NOTE — ED Provider Notes (Signed)
CSN: [id number]     Arrival date & time 04/01/15  1536 History   First MD Initiated Contact with Patient 04/01/15 1556     Chief Complaint  Patient presents with  . Back Pain   (Consider location/radiation/quality/duration/timing/severity/associated sxs/prior Treatment) Patient is a 64 y.o. female presenting with back pain. The history is provided by the patient. No language interpreter was used.  Back Pain  Katie Mccarthy is a 64 year old female with a history of CABG, lumbar fusion (01/31/2015), hypertension, diabetes, and HIV who presents via EMS for worsening lumbar pain 2 days. She mentioned that she feels like something has moved in her back that is causing her symptoms. She felt better after surgery but states that she feels back to her baseline before she had surgery. She states it comes with intermittent chest pain but denies any shortness of breath or diaphoresis. She denies any lifting, fall, or injury. She states she is on disability and does not work. She has been taking Percocet for pain but has not taken any today. She also reports numbness to the right side of her right lower extremity. Patient reports that she wears a back brace to go up and down her staircase. She denies any fever, chills, abdominal pain, nausea, vomiting, bowel or bladder incontinence or retention, recent prednisone use, history of cancer, lower extremity weakness, or IV drug use.   Past Medical History  Diagnosis Date  . Coronary artery disease   . Hypertension   . Diabetes mellitus without complication (HCC)   . HIV infection (HCC)   . Sciatica   . Blood dyscrasia     hiv   Past Surgical History  Procedure Laterality Date  . Cardiac catheterization    . Coronary artery bypass graft    . Maximum access (mas)posterior lumbar interbody fusion (plif) 1 level N/A 01/31/2015    Procedure: Lumbar four to five Decompression and Posterior Lumbar Interbody Fusion;  Surgeon: Loura Halt Ditty, MD;  Location: [facility];  Service: Neurosurgery;  Laterality: N/A;  L4-5 MAXIMUM ACCESS POSTERIOR LUMBAR INTERBODY FUSION   Family History  Problem Relation Age of Onset  . Diabetes Mother   . Heart disease Mother   . Diabetes Father   . Coronary artery disease Father   . Diabetes Sister   . Diabetes Brother    Social History  Substance Use Topics  . Smoking status: Former Smoker    Types: Cigarettes    Quit date: 11/23/2010  . Smokeless tobacco: Never Used  . Alcohol Use: No   OB History    Gravida Para Term Preterm AB TAB SAB Ectopic Multiple Living       Review of Systems  Musculoskeletal: Positive for back pain.  All other systems reviewed and are negative.     Allergies  Morphine and related and Rosuvastatin  Home Medications   Prior to Admission medications   Medication Sig Start Date End Date Taking? Authorizing Provider  albuterol (PROVENTIL HFA;VENTOLIN HFA) 108 (90 BASE) MCG/ACT inhaler Inhale into the lungs every 6 (six) hours as needed for wheezing or shortness of breath.   Yes Historical Provider, MD  amLODipine (NORVASC) 10 MG tablet Take 10 mg by mouth daily.   Yes Historical Provider, MD  aspirin 81 MG chewable tablet Chew 81 mg by mouth daily.    Yes Historical Provider, MD  atorvastatin (LIPITOR) 40 MG tablet Take 40 mg by mouth daily.   Yes  Historical Provider, MD  carvedilol (COREG) 12.5 MG tablet TAKE 1 TABLET BY MOUTH TWICE DAILY WITH A MEAL 02/21/15  Yes Judyann Munson, MD  cloNIDine (CATAPRES) 0.1 MG tablet Take 0.1 mg by mouth 2 (two) times daily.   Yes Historical Provider, MD  gabapentin (NEURONTIN) 300 MG capsule Take 1 capsule (300 mg total) by mouth 3 (three) times daily. Start with 1 tab daily x 5 days; then 1 tab twice/day x 5 days;then 3x/day Patient taking differently: Take 300 mg by mouth every evening.  11/21/14  Yes Judyann Munson, MD  isosorbide mononitrate (IMDUR) 60 MG 24 hr tablet Take 60 mg by mouth daily.   Yes Historical  Provider, MD  lisinopril (PRINIVIL,ZESTRIL) 40 MG tablet TAKE 1 TABLET BY MOUTH EVERY DAY 01/30/15  Yes Judyann Munson, MD  metFORMIN (GLUCOPHAGE) 500 MG tablet Take 500 mg by mouth daily with breakfast.   Yes Historical Provider, MD  oxyCODONE-acetaminophen (PERCOCET/ROXICET) 5-325 MG tablet Take 1 tablet by mouth every 8 (eight) hours as needed for moderate pain. 02/21/15  Yes Judyann Munson, MD  pantoprazole (PROTONIX) 40 MG tablet Take 1 tablet (40 mg total) by mouth daily. 02/04/14  Yes Stark Bray, MD  prasugrel (EFFIENT) 10 MG TABS tablet Take 10 mg by mouth daily.   Yes Historical Provider, MD  terbinafine (LAMISIL) 250 MG tablet Take 250 mg by mouth daily.   Yes Historical Provider, MD  TRIUMEQ 600-50-300 MG TABS TAKE 1 TABLET BY MOUTH EVERY NIGHT AT BEDTIME 11/08/14  Yes Judyann Munson, MD  carvedilol (COREG) 12.5 MG tablet TAKE 1 TABLET BY MOUTH TWICE DAILY WITH A MEAL Patient not taking: Reported on 04/01/2015 03/20/15   Judyann Munson, MD  methocarbamol (ROBAXIN) 500 MG tablet Take 1.5 tablets (750 mg total) by mouth every 6 (six) hours as needed for muscle spasms. Patient not taking: Reported on 04/01/2015 02/01/15   Loura Halt Ditty, MD  nitroGLYCERIN (NITROSTAT) 0.4 MG SL tablet Place 0.4 mg under the tongue every 5 (five) minutes as needed for chest pain.    Historical Provider, MD   BP 114/99 mmHg  Pulse 82  Temp(Src) 98.6 F (37 C)  Resp 16  SpO2 100% Physical Exam  Constitutional: She is oriented to person, place, and time. She appears well-developed and well-nourished. No distress.  HENT:  Head: Normocephalic and atraumatic.  Eyes: Conjunctivae are normal.  Neck: Normal range of motion. Neck supple.  Cardiovascular: Normal rate, regular rhythm and normal heart sounds.   Pulmonary/Chest: Effort normal and breath sounds normal. No respiratory distress. She has no wheezes. She has no rales.  Abdominal: Soft. She exhibits no distension. There is no tenderness. There is  no rebound.  No abdominal tenderness to palpation.  Genitourinary:  Large pendulous breasts bilaterally.  Musculoskeletal: Normal range of motion.  Lumbar midline tenderness to palpation. Surgical scar is without drainage or surrounding erythema.    Neurological: She is alert and oriented to person, place, and time.  No sensory deficits. Normal motor and strength in lower extremities. Able to dorsi and plantar flex without difficulty. Able to straight leg raise bilaterally to 15 degrees but with pain.  No cervical or thoracic midline or paravertebral tenderness to palpation.  Skin: Skin is warm and dry.  Nursing note and vitals reviewed.   ED Course  Procedures (including critical care time) Labs Review Labs Reviewed  URINALYSIS, ROUTINE W REFLEX MICROSCOPIC (NOT AT [facility]) - Abnormal; Notable for the following:    Ketones, ur 15 (*)  All other components within normal limits  I-STAT TROPOININ, ED    Imaging Review Dg Lumbar Spine 2-3 Views  04/01/2015  CLINICAL DATA:  Low back pain radiating to the right buttocks. L4-5 surgery on 01/31/2015 EXAM: LUMBAR SPINE - 2-3 VIEW COMPARISON:  Intraoperative fluoroscopy 01/31/2015. MRI lumbar spine 01/19/2015 FINDINGS: Postoperative changes with laminectomy, posterior rod and screw fixation, and intervertebral disc prosthesis at L4-5. There is minimal anterior subluxation of L4 on L5 which appears unchanged since the previous study. Otherwise normal alignment. Degenerative changes throughout the lumbar spine with mild endplate hypertrophic changes present. No vertebral compression deformities. Intervertebral disc space heights are preserved. No focal bone lesion or bone destruction. Visualized sacrum appears intact. Vascular calcifications. IMPRESSION: Postoperative changes with posterior fixation at L4-5. Alignment and appearance of surgical hardware appear unchanged. Electronically Signed   By: Burman NievesWilliam  Stevens M.D.   On: 04/01/2015 18:30   I  have personally reviewed and evaluated these images and lab results as part of my medical decision-making.   EKG Interpretation   Date/Time:  Saturday April 01 2015 15:43:31 EST Ventricular Rate:  85 PR Interval:  153 QRS Duration: 78 QT Interval:  364 QTC Calculation: 433 R Axis:   -7 Text Interpretation:  Sinus rhythm Abnormal T, consider ischemia, lateral  leads No significant change since last tracing Confirmed by NGUYEN, EMILY  [phone]([id number]) on 04/01/2015 3:52:05 PM      MDM   Final diagnoses:  Right-sided low back pain with right-sided sciatica  Patient presents for worsening lower back pain over the past 2 days with intermittent numbness to the right lateral thigh. She had spondylolisthesis at L4-5 and neurosurgery, Dr. Sharlet SalinaBenjamin Ditty, did an L4-5 decompression and PLIF 2 months ago. She wears a back brace. She has a history of CABG. She reports chest pain from her back pain. A troponin and EKG was checked and is not concerning. She has no significant change from her previous EKG. Recheck: Patient is ambulatory to the bathroom with steady gait. She does not have a UTI. Lumbar xray shows postop changes with posterior fixation at L4-5.  The hardware appears unchanged.  I discussed these findings with the patient.  She has no red flag symptoms at this time.  She has an appointment in 3 weeks with her neurosurgeon.  I discussed calling to make an earlier appointment.  I also discussed return precautions and she verbally agrees with the plan.  Medications  oxyCODONE-acetaminophen (PERCOCET/ROXICET) 5-325 MG per tablet 2 tablet (2 tablets Oral Given 04/01/15 1836)   Filed Vitals:   04/01/15 1845 04/01/15 1900  BP: 119/73 114/99  Pulse: 75 82  Temp:    Resp:  [address]16       Viggo Perko Patel-Mills, PA-C 04/03/15 [id number]  Gwyneth SproutWhitney Plunkett, MD 04/04/15 2147

## 2015-04-01 NOTE — ED Notes (Signed)
Pt assisted to lobby in wheelchair. No further questions/concerns.

## 2015-04-12 ENCOUNTER — Other Ambulatory Visit: Payer: Self-pay

## 2015-04-12 DIAGNOSIS — Z1231 Encounter for screening mammogram for malignant neoplasm of breast: Secondary | ICD-10-CM

## 2015-04-14 ENCOUNTER — Ambulatory Visit: Payer: Medicare Other

## 2015-04-17 ENCOUNTER — Ambulatory Visit: Payer: Medicare Other | Attending: Physician Assistant | Admitting: Physical Therapy

## 2015-04-17 DIAGNOSIS — M5441 Lumbago with sciatica, right side: Secondary | ICD-10-CM | POA: Diagnosis present

## 2015-04-17 DIAGNOSIS — R293 Abnormal posture: Secondary | ICD-10-CM

## 2015-04-17 DIAGNOSIS — M62830 Muscle spasm of back: Secondary | ICD-10-CM

## 2015-04-17 DIAGNOSIS — M2560 Stiffness of unspecified joint, not elsewhere classified: Secondary | ICD-10-CM | POA: Diagnosis present

## 2015-04-17 DIAGNOSIS — R29898 Other symptoms and signs involving the musculoskeletal system: Secondary | ICD-10-CM

## 2015-04-17 DIAGNOSIS — M5386 Other specified dorsopathies, lumbar region: Secondary | ICD-10-CM

## 2015-04-17 NOTE — Therapy (Addendum)
[facility] Rehabilitation [facility] [address] [place], [place], [address] Phone: [phone]   Fax:  [phone]  Physical Therapy Treatment  Patient Details  Name: Katie Mccarthy MRN: [id number] Date of Birth: [birth year] Referring Provider: Sharlet Salina Diddy Md  Encounter Date: 04/17/2015      PT End of Session - 04/17/15 1638    Visit Number 1   Number of Visits 16   Date for PT Re-Evaluation 06/12/15   PT Start Time 1545   PT Stop Time 1630   PT Time Calculation (min) 45 min   Activity Tolerance Patient tolerated treatment well   Behavior During Therapy [facility] for tasks assessed/performed      Past Medical History  Diagnosis Date  . Coronary artery disease   . Hypertension   . Diabetes mellitus without complication (HCC)   . HIV infection (HCC)   . Sciatica   . Blood dyscrasia     hiv    Past Surgical History  Procedure Laterality Date  . Cardiac catheterization    . Coronary artery bypass graft    . Maximum access (mas)posterior lumbar interbody fusion (plif) 1 level N/A 01/31/2015    Procedure: Lumbar four to five Decompression and Posterior Lumbar Interbody Fusion;  Surgeon: Loura Halt Ditty, MD;  Location: [facility];  Service: Neurosurgery;  Laterality: N/A;  L4-5 MAXIMUM ACCESS POSTERIOR LUMBAR INTERBODY FUSION    There were no vitals filed for this visit.  Visit Diagnosis:  Right-sided low back pain with right-sided sciatica - Plan: PT plan of care cert/re-cert  Decreased ROM of lumbar spine - Plan: PT plan of care cert/re-cert  Weakness of both legs - Plan: PT plan of care cert/re-cert  Abnormal posture - Plan: PT plan of care cert/re-cert  Muscle spasm of back - Plan: PT plan of care cert/re-cert      Subjective Assessment - 04/17/15 1551    Subjective pt is a 64 y.o F with CC of low back pain since L4-L5 fusion surgery in 02/02/2015. Since the Surgery she reports the pain is gradually and she states the pain has been  going down her RLE to the feet and toes. she reports no relief from the radiating symtoms.    Limitations Sitting;Standing;Walking;House hold activities;Lifting   How long can you sit comfortably? 5 min   How long can you stand comfortably? 5 min   How long can you walk comfortably? 5 min   Diagnostic tests 04/01/2015 harware remains unchanged   Patient Stated Goals to get pain to stop hurting, to get out and do stuff   Currently in Pain? Yes   Pain Score 9    Pain Location Back   Pain Orientation Right;Lower   Pain Descriptors / Indicators Aching;Burning;Sore;Sharp   Pain Type Chronic pain   Pain Radiating Towards RLE to the foot   Pain Onset More than a month ago   Pain Frequency Constant   Aggravating Factors  prolonged sitting, standing, walking, moving around   Pain Relieving Factors pain medication   Effect of Pain on Daily Activities limited endurance with sitting/ standing/ walking.             [facility] PT Assessment - 04/17/15 1601    Assessment   Medical Diagnosis low back pain s/p L4-L5 lumbar fusion   Referring Provider Sharlet Salina Diddy Md   Onset Date/Surgical Date 02/02/15   Hand Dominance Right   Next MD Visit 04/27/2015   Prior Therapy no   Precautions  Precautions Other (comment)  no reaching, no lifting, weight lifting, and no bending   Restrictions   Weight Bearing Restrictions No   Balance Screen   Has the patient fallen in the past 6 months No   Has the patient had a decrease in activity level because of a fear of falling?  No   Is the patient reluctant to leave their home because of a fear of falling?  No   Home Environment   Living Environment Private residence   Living Arrangements Spouse/significant other   Available Help at Discharge Available 24 hours/day;Available PRN/intermittently   Type of Home Apartment   Home Access Level entry   Home Layout Two level   Alternate Level Stairs-Number of Steps 17   Home Equipment Cane - single point;Cane -  quad   Prior Function   Level of Independence Needs assistance with homemaking;Needs assistance with ADLs   Vocation On disability   Leisure going out when getting pay check, talking on the phone.    Cognition   Overall Cognitive Status Within Functional Limits for tasks assessed   Observation/Other Assessments   Focus on Therapeutic Outcomes (FOTO)  67% limited  predicted 51% limited   Posture/Postural Control   Posture/Postural Control Postural limitations   Postural Limitations Rounded Shoulders;Forward head   ROM / Strength   AROM / PROM / Strength AROM;PROM;Strength   AROM   AROM Assessment Site Lumbar   Lumbar Flexion 50   Lumbar Extension 15   Lumbar - Right Side Bend 18   Lumbar - Left Side Bend 18   Lumbar - Right Rotation --  not assessed today   Lumbar - Left Rotation --  not assessed today   Strength   Strength Assessment Site Hip;Knee   Right/Left Hip Right;Left   Right Hip Flexion 4-/5   Right Hip Extension 4-/5   Right Hip ABduction 3/5   Right Hip ADduction 5/5   Left Hip Flexion 4-/5   Left Hip Extension 4-/5   Left Hip ABduction 3/5   Left Hip ADduction 5/5   Right/Left Knee Right;Left   Right Knee Flexion 4+/5   Right Knee Extension 4+/5   Left Knee Flexion 4+/5   Left Knee Extension 4+/5   Flexibility   Soft Tissue Assessment /Muscle Length yes   Hamstrings 52 degrees on L, 45 on the R with pain in the low back   Special Tests    Special Tests Lumbar   Lumbar Tests Slump Test   Slump test   Findings Positive   Side Right   Ambulation/Gait   Gait Pattern Step-through pattern;Decreased stride length;Antalgic;Decreased trunk rotation                             PT Education - 04/17/15 1638    Education provided Yes   Education Details evaluation findings, POC, goals, HEP   Person(s) Educated Patient   Methods Explanation   Comprehension Verbalized understanding          PT Short Term Goals - 04/17/15 1733    PT  SHORT TERM GOAL #1   Title pt will be I with inital HEP (05/17/2014)   Time 4   Period Weeks   Status New   PT SHORT TERM GOAL #2   Title pt will report decreased low back pain to </= 6/10 to assist with therapeutic exercises (05/17/2014)   Time 4   Period Weeks   Status New  PT SHORT TERM GOAL #3   Title pt will be able to exhibit proper trunk posture and lifting and carrying mechanics to reduce low back pain and reinjury (05/17/2014)   Time 4   Period Weeks   Status New   PT SHORT TERM GOAL #4   Title pt will be able to walk/ standing >/= 8 minutes with </= 6/10 pain to promote safety and endurance (05/17/2014)   Time 4   Period Weeks   Status New           PT Long Term Goals - 04/17/15 1736    PT LONG TERM GOAL #1   Title pt will be I with all HEP at discharge (06/12/2015)   Time 8   Period Weeks   Status New   PT LONG TERM GOAL #2   Title pt will improve trunk mobility by >/= 5 degrees in all planes to promote improved moblity for ADLs (06/12/2015)   Time 8   Period Weeks   Status New   PT LONG TERM GOAL #3   Title pt will be able to stand/ walk for >/= 10 minutes with </= 4/10 pain to promote walking / standing endurance for ADLS (06/12/2015)   Time 8   Period Weeks   Status New   PT LONG TERM GOAL #4   Title pt will be able to don/ doff her clothing with </= 4/10 pain without assistance to promote independence with ADLs (06/12/2015)   Time 8   Period Weeks   Status New   PT LONG TERM GOAL #5   Title pt will improve her FOTO score to >/= 45 at discharge to demonstrate improved function (06/12/2015)   Time 8   Period Weeks   Status New        G-code: Changing and maintaining Body position Functional assessmen FOTO 67% limited Current Status: CL goals Status: CK       Plan - 04/17/15 1639    Clinical Impression Statement Milanni presents to OPPT s/p L4-L5 lumbar fusion on 02/02/2015, with report that R Low back pain ahs gradually worsened since the surgery. She  demonstrates limited trunk mobility with pain and tightness noted at all end ranges. todays evaluation was limited due to pain and tightness. in the low back. She reported raditing pain down the RLE into the toes, and special testing was positive for slump testing. palpation revealed tightness and tenderness at the glute medius/ minimius on the RLE only. She would benefit from physical therapy to decrease low back pain and improve function by addressing the impairments listed.    Pt will benefit from skilled therapeutic intervention in order to improve on the following deficits Pain;Postural dysfunction;Improper body mechanics;Decreased strength;Hypomobility;Decreased endurance;Decreased activity tolerance;Decreased balance;Difficulty walking;Decreased range of motion   Rehab Potential Good   PT Frequency 2x / week   PT Duration 8 weeks   PT Treatment/Interventions ADLs/Self Care Home Management;Cryotherapy;Electrical Stimulation;Iontophoresis 4mg /ml Dexamethasone;Moist Heat;Therapeutic exercise;Therapeutic activities;Manual techniques;Taping;Dry needling;Patient/family education   PT Next Visit Plan assess response to HEP, modaliites PRN for pain, manual for low back, core strengthening   PT Home Exercise Plan see HEP handout   Consulted and Agree with Plan of Care Patient        Problem List Patient Active Problem List   Diagnosis Date Noted  . Lumbar spondylosis 01/31/2015  . Chest pain 02/03/2014  . LSIL (low grade squamous intraepithelial lesion) on Pap smear 12/30/2012  . HIV (human immunodeficiency virus infection) (HCC) 04/27/2012  .  Gout 04/27/2012  . Hypertension 04/27/2012  . Diabetes (HCC) 04/27/2012  . Asthma 04/27/2012  . Hypercholesteremia 04/27/2012  . CAD (coronary artery disease) 04/27/2012  . Hx of CABG 04/27/2012   Lulu Riding PT, DPT, LAT, ATC  04/17/2015  5:50 PM     [facility] [address] [place], [place], [address] Phone: [phone]   Fax:  [phone]  Name: Katie Mccarthy MRN: [id number] Date of Birth: 09/02/1950

## 2015-04-17 NOTE — Patient Instructions (Signed)
   Kristoffer Leamon PT, DPT, LAT, ATC  [place] [facility] Phone: [phone]     

## 2015-04-19 ENCOUNTER — Ambulatory Visit: Payer: Medicare Other | Admitting: Physical Therapy

## 2015-04-19 DIAGNOSIS — M5441 Lumbago with sciatica, right side: Secondary | ICD-10-CM

## 2015-04-19 DIAGNOSIS — M5386 Other specified dorsopathies, lumbar region: Secondary | ICD-10-CM

## 2015-04-19 DIAGNOSIS — R293 Abnormal posture: Secondary | ICD-10-CM

## 2015-04-19 DIAGNOSIS — R29898 Other symptoms and signs involving the musculoskeletal system: Secondary | ICD-10-CM

## 2015-04-19 DIAGNOSIS — M62830 Muscle spasm of back: Secondary | ICD-10-CM

## 2015-04-19 NOTE — Therapy (Signed)
[place], [place], [address] Phone: [phone]   Fax:  [phone]  Physical Therapy Treatment  Patient Details  Name: Katie Mccarthy MRN: [id number] Date of Birth: [birth year] Referring Provider: Marland Kitchen Diddy Md  Encounter Date: 04/19/2015      PT End of Session - 04/19/15 1141    Visit Number 2   Number of Visits 16   Date for PT Re-Evaluation 06/12/15   PT Start Time 1017   PT Stop Time 1117   PT Time Calculation (min) 60 min   Activity Tolerance Patient tolerated treatment well;Patient limited by pain   Behavior During Therapy [facility] for tasks assessed/performed      Past Medical History  Diagnosis Date  . Coronary artery disease   . Hypertension   . Diabetes mellitus without complication ([place])   . HIV infection ([place])   . Sciatica   . Blood dyscrasia     hiv    Past Surgical History  Procedure Laterality Date  . Cardiac catheterization    . Coronary artery bypass graft    . Maximum access (mas)posterior lumbar interbody fusion (plif) 1 level N/A 01/31/2015    Procedure: Lumbar four to five Decompression and Posterior Lumbar Interbody Fusion;  Surgeon: Kevan Ny Ditty, MD;  Location: [facility];  Service: Neurosurgery;  Laterality: N/A;  L4-5 MAXIMUM ACCESS POSTERIOR LUMBAR INTERBODY FUSION    There were no vitals filed for this visit.  Visit Diagnosis:  Right-sided low back pain with right-sided sciatica  Decreased ROM of lumbar spine  Weakness of both legs  Abnormal posture  Muscle spasm of back      Subjective Assessment - 04/19/15 1027    Subjective Did her hamstring stretch 1 time since lst visit.     Currently in Pain? Yes   Pain Score 8    Pain Location Back   Pain Orientation Right;Lower   Pain Descriptors / Indicators Aching   Pain Radiating Towards not today yet   Aggravating Factors  prolonged sitting, standing walking.  Moving too long   Pain Relieving  Factors pain medication                         [facility] Adult PT Treatment/Exercise - 04/19/15 1037    Self-Care   Self-Care --  Must wear brace as MD suggests.then wean  ADL ed beginning   Lumbar Exercises: Stretches   Passive Hamstring Stretch 3 reps;30 seconds   Lower Trunk Rotation 3 reps;30 seconds   Pelvic Tilt 5 reps  cues, uses legs   Piriformis Stretch 3 reps;30 seconds   Moist Heat Therapy   Number Minutes Moist Heat 15 Minutes   Moist Heat Location --  lowback, gluteals   Manual Therapy   Manual therapy comments Soft tissue work Sidelying , to low back ang gluteals.  tissue softened post manual                PT Education - 04/19/15 1138    Education provided Yes   Education Details ADL handout, sleeping posture discussed.   Person(s) Educated Patient;Spouse   Methods Explanation;Handout;Verbal cues   Comprehension Verbalized understanding;Need further instruction          PT Short Term Goals - 04/17/15 1733    PT SHORT TERM GOAL #1   Title pt will be I with inital HEP (05/17/2014)   Time 4   Period Weeks   Status New  PT SHORT TERM GOAL #2   Title pt will report decreased low back pain to </= 6/10 to assist with therapeutic exercises (05/17/2014)   Time 4   Period Weeks   Status New   PT SHORT TERM GOAL #3   Title pt will be able to exhibit proper trunk posture and lifting and carrying mechanics to reduce low back pain and reinjury (05/17/2014)   Time 4   Period Weeks   Status New   PT SHORT TERM GOAL #4   Title pt will be able to walk/ standing >/= 8 minutes with </= 6/10 pain to promote safety and endurance (05/17/2014)   Time 4   Period Weeks   Status New           PT Long Term Goals - 04/17/15 1736    PT LONG TERM GOAL #1   Title pt will be I with all HEP at discharge (06/12/2015)   Time 8   Period Weeks   Status New   PT LONG TERM GOAL #2   Title pt will improve trunk mobility by >/= 5 degrees in all planes to  promote improved moblity for ADLs (06/12/2015)   Time 8   Period Weeks   Status New   PT LONG TERM GOAL #3   Title pt will be able to stand/ walk for >/= 10 minutes with </= 4/10 pain to promote walking / standing endurance for ADLS (07/06/3555)   Time 8   Period Weeks   Status New   PT LONG TERM GOAL #4   Title pt will be able to don/ doff her clothing with </= 4/10 pain without assistance to promote independence with ADLs (06/12/2015)   Time 8   Period Weeks   Status New   PT LONG TERM GOAL #5   Title pt will improve her FOTO score to >/= 45 at discharge to demonstrate improved function (06/12/2015)   Time 8   Period Weeks   Status New               Plan - 04/19/15 1142    Clinical Impression Statement Supine position tolerated for part of session, then pain limited.  Patient is not yer consistant with her home exercises tho she did stretch her hamstrings.  Leg pain intermittant.     PT Next Visit Plan advance home exercise if ready.  Assess treatment .  Issue home walking program.   Consulted and Agree with Plan of Care Patient;Family member/caregiver        Problem List Patient Active Problem List   Diagnosis Date Noted  . Lumbar spondylosis 01/31/2015  . Chest pain 02/03/2014  . LSIL (low grade squamous intraepithelial lesion) on Pap smear 12/30/2012  . HIV (human immunodeficiency virus infection) ([place]) 04/27/2012  . Gout 04/27/2012  . Hypertension 04/27/2012  . Diabetes ([place]) 04/27/2012  . Asthma 04/27/2012  . Hypercholesteremia 04/27/2012  . CAD (coronary artery disease) 04/27/2012  . Hx of CABG 04/27/2012    [facility] 04/19/2015, 11:47 AM  [facility] [address] [place], [place], [address] Phone: [phone]   Fax:  [phone]  Name: ALLAINA BROTZMAN MRN: [id number] Date of Birth: 06/04/1950    Melvenia Needles, PTA 04/19/2015 11:47 AM Phone: [phone] Fax: [phone] Melvenia Needles,  PTA 04/19/2015 11:47 AM Phone: [phone] Fax: [phone]

## 2015-04-19 NOTE — Patient Instructions (Addendum)

## 2015-04-21 ENCOUNTER — Other Ambulatory Visit: Payer: Self-pay | Admitting: Internal Medicine

## 2015-04-24 ENCOUNTER — Ambulatory Visit: Payer: Medicare Other | Admitting: Physical Therapy

## 2015-04-26 ENCOUNTER — Ambulatory Visit: Payer: Medicare Other | Admitting: Physical Therapy

## 2015-04-26 DIAGNOSIS — M5441 Lumbago with sciatica, right side: Secondary | ICD-10-CM

## 2015-04-26 DIAGNOSIS — R293 Abnormal posture: Secondary | ICD-10-CM

## 2015-04-26 DIAGNOSIS — M5386 Other specified dorsopathies, lumbar region: Secondary | ICD-10-CM

## 2015-04-26 DIAGNOSIS — R29898 Other symptoms and signs involving the musculoskeletal system: Secondary | ICD-10-CM

## 2015-04-26 DIAGNOSIS — M62830 Muscle spasm of back: Secondary | ICD-10-CM

## 2015-04-26 NOTE — Therapy (Signed)
[facility] [address] [place], [place], [address] Phone: [phone]   Fax:  [phone]  Physical Therapy Treatment  Patient Details  Name: Katie Mccarthy MRN: [id number] Date of Birth: Oct 03, 1950 Referring Provider: Sharlet SalinaBenjamin Diddy Md  Encounter Date: 04/26/2015    Past Medical History  Diagnosis Date  . Coronary artery disease   . Hypertension   . Diabetes mellitus without complication (HCC)   . HIV infection (HCC)   . Sciatica   . Blood dyscrasia     hiv    Past Surgical History  Procedure Laterality Date  . Cardiac catheterization    . Coronary artery bypass graft    . Maximum access (mas)posterior lumbar interbody fusion (plif) 1 level N/A 01/31/2015    Procedure: Lumbar four to five Decompression and Posterior Lumbar Interbody Fusion;  Surgeon: Loura HaltBenjamin Jared Ditty, MD;  Location: [facility];  Service: Neurosurgery;  Laterality: N/A;  L4-5 MAXIMUM ACCESS POSTERIOR LUMBAR INTERBODY FUSION    There were no vitals filed for this visit.  Visit Diagnosis:  Right-sided low back pain with right-sided sciatica  Decreased ROM of lumbar spine  Weakness of both legs  Abnormal posture  Muscle spasm of back      Subjective Assessment - 04/26/15 1036    Subjective Patient reporting that she had just thrown up in the waiting room before coming back for PT session. Patient states she hasn't been feeling very good and maybe just got too hot. Patient describing that she is having continued stomach discomfort while discussing options with session. Patient agreeing  that PT session would not be a good idea right now.                                    PT Short Term Goals - 04/17/15 1733    PT SHORT TERM GOAL #1   Title pt will be I with inital HEP (05/17/2014)   Time 4   Period Weeks   Status New   PT SHORT TERM GOAL #2   Title pt will report decreased low back pain to </= 6/10 to assist with  therapeutic exercises (05/17/2014)   Time 4   Period Weeks   Status New   PT SHORT TERM GOAL #3   Title pt will be able to exhibit proper trunk posture and lifting and carrying mechanics to reduce low back pain and reinjury (05/17/2014)   Time 4   Period Weeks   Status New   PT SHORT TERM GOAL #4   Title pt will be able to walk/ standing >/= 8 minutes with </= 6/10 pain to promote safety and endurance (05/17/2014)   Time 4   Period Weeks   Status New           PT Long Term Goals - 04/17/15 1736    PT LONG TERM GOAL #1   Title pt will be I with all HEP at discharge (06/12/2015)   Time 8   Period Weeks   Status New   PT LONG TERM GOAL #2   Title pt will improve trunk mobility by >/= 5 degrees in all planes to promote improved moblity for ADLs (06/12/2015)   Time 8   Period Weeks   Status New   PT LONG TERM GOAL #3   Title pt will be able to stand/ walk for >/= 10 minutes with </= 4/10 pain to promote  walking / standing endurance for ADLS (06/12/2015)   Time 8   Period Weeks   Status New   PT LONG TERM GOAL #4   Title pt will be able to don/ doff her clothing with </= 4/10 pain without assistance to promote independence with ADLs (06/12/2015)   Time 8   Period Weeks   Status New   PT LONG TERM GOAL #5   Title pt will improve her FOTO score to >/= 45 at discharge to demonstrate improved function (06/12/2015)   Time 8   Period Weeks   Status New               Plan - 04/26/15 1036    Clinical Impression Statement Patient becoming ill with emesis prior to PT session, not appropriate for session at this time. Patient states she is going to f/u with her physician tomorrow and is already schedued for appointments next week.         Problem List Patient Active Problem List   Diagnosis Date Noted  . Lumbar spondylosis 01/31/2015  . Chest pain 02/03/2014  . LSIL (low grade squamous intraepithelial lesion) on Pap smear 12/30/2012  . HIV (human immunodeficiency virus  infection) (HCC) 04/27/2012  . Gout 04/27/2012  . Hypertension 04/27/2012  . Diabetes (HCC) 04/27/2012  . Asthma 04/27/2012  . Hypercholesteremia 04/27/2012  . CAD (coronary artery disease) 04/27/2012  . 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 [place], [place], [address] Phone: [phone]   Fax:  [phone]  Name: Katie Mccarthy MRN: [id number] Date of Birth: 12-08-1950

## 2015-05-03 ENCOUNTER — Ambulatory Visit: Payer: Medicare Other | Admitting: Physical Therapy

## 2015-05-03 DIAGNOSIS — M5441 Lumbago with sciatica, right side: Secondary | ICD-10-CM | POA: Diagnosis not present

## 2015-05-03 DIAGNOSIS — M5386 Other specified dorsopathies, lumbar region: Secondary | ICD-10-CM

## 2015-05-03 DIAGNOSIS — R293 Abnormal posture: Secondary | ICD-10-CM

## 2015-05-03 DIAGNOSIS — R29898 Other symptoms and signs involving the musculoskeletal system: Secondary | ICD-10-CM

## 2015-05-03 DIAGNOSIS — M62830 Muscle spasm of back: Secondary | ICD-10-CM

## 2015-05-03 NOTE — Therapy (Signed)
[facility] [address] [place], [place], [address] Phone: [phone]   Fax:  [phone]  Physical Therapy Treatment  Patient Details  Name: Katie Mccarthy MRN: [id number] Date of Birth: 1951-01-07 Referring Provider: Sharlet Salina Diddy Md  Encounter Date: 05/03/2015      PT End of Session - 05/03/15 1230    Visit Number 3   Number of Visits 16   Date for PT Re-Evaluation 06/12/15   PT Start Time 1145   PT Stop Time 1230   PT Time Calculation (min) 45 min   Activity Tolerance Patient tolerated treatment well   Behavior During Therapy [facility] for tasks assessed/performed      Past Medical History  Diagnosis Date  . Coronary artery disease   . Hypertension   . Diabetes mellitus without complication (HCC)   . HIV infection (HCC)   . Sciatica   . Blood dyscrasia     hiv    Past Surgical History  Procedure Laterality Date  . Cardiac catheterization    . Coronary artery bypass graft    . Maximum access (mas)posterior lumbar interbody fusion (plif) 1 level N/A 01/31/2015    Procedure: Lumbar four to five Decompression and Posterior Lumbar Interbody Fusion;  Surgeon: Loura Halt Ditty, MD;  Location: [facility];  Service: Neurosurgery;  Laterality: N/A;  L4-5 MAXIMUM ACCESS POSTERIOR LUMBAR INTERBODY FUSION    There were no vitals filed for this visit.  Visit Diagnosis:  Right-sided low back pain with right-sided sciatica  Decreased ROM of lumbar spine  Weakness of both legs  Abnormal posture  Muscle spasm of back      Subjective Assessment - 05/03/15 1140    Subjective " I feeling better today than last time, the low back pain is better when I do my exercises."   Currently in Pain? Yes   Pain Score 4    Pain Location Back   Pain Orientation Right;Left   Pain Descriptors / Indicators Aching   Pain Type Chronic pain   Pain Onset More than a month ago   Pain Frequency Constant   Aggravating Factors  prolonged  sitting   Pain Relieving Factors exercises ( when she reports doing them)                         [facility] Adult PT Treatment/Exercise - 05/03/15 1151    Self-Care   Self-Care Other Self-Care Comments   Other Self-Care Comments  importance of doing exercises at home inorder to progress benefit   Lumbar Exercises: Stretches   Passive Hamstring Stretch 2 reps;30 seconds   Lower Trunk Rotation 2 reps;30 seconds   Pelvic Tilt 5 reps;10 seconds  with feet on physioball   Piriformis Stretch 2 reps;30 seconds   Lumbar Exercises: Aerobic   Stationary Bike Nu-Step L2   Lumbar Exercises: Seated   Sit to Stand --  8 reps, VC to not use back of legs on table   Lumbar Exercises: Supine   Clam 10 reps  2 sets with red theraband   Bridge 10 reps  1 set with glute squeeze   Other Supine Lumbar Exercises rolling on ball with Le hip flex/ext and knee flexion/ ext  2 x 10                 PT Education - 05/03/15 1224    Education provided Yes   Education Details reviewed HEP, and importance for doing it at  home.   Person(s) Educated Patient;Spouse   Methods Explanation   Comprehension Verbalized understanding          PT Short Term Goals - 05/03/15 1232    PT SHORT TERM GOAL #1   Title pt will be I with inital HEP (05/17/2014)   Time 4   Period Weeks   Status On-going   PT SHORT TERM GOAL #2   Title pt will report decreased low back pain to </= 6/10 to assist with therapeutic exercises (05/17/2014)   Time 4   Period Weeks   Status On-going   PT SHORT TERM GOAL #3   Title pt will be able to exhibit proper trunk posture and lifting and carrying mechanics to reduce low back pain and reinjury (05/17/2014)   Time 4   Period Weeks   Status On-going   PT SHORT TERM GOAL #4   Title pt will be able to walk/ standing >/= 8 minutes with </= 6/10 pain to promote safety and endurance (05/17/2014)   Time 4   Period Weeks   Status On-going           PT Long Term Goals  - 04/17/15 1736    PT LONG TERM GOAL #1   Title pt will be I with all HEP at discharge (06/12/2015)   Time 8   Period Weeks   Status New   PT LONG TERM GOAL #2   Title pt will improve trunk mobility by >/= 5 degrees in all planes to promote improved moblity for ADLs (06/12/2015)   Time 8   Period Weeks   Status New   PT LONG TERM GOAL #3   Title pt will be able to stand/ walk for >/= 10 minutes with </= 4/10 pain to promote walking / standing endurance for ADLS (06/12/2015)   Time 8   Period Weeks   Status New   PT LONG TERM GOAL #4   Title pt will be able to don/ doff her clothing with </= 4/10 pain without assistance to promote independence with ADLs (06/12/2015)   Time 8   Period Weeks   Status New   PT LONG TERM GOAL #5   Title pt will improve her FOTO score to >/= 45 at discharge to demonstrate improved function (06/12/2015)   Time 8   Period Weeks   Status New               Plan - 05/03/15 1230    Clinical Impression Statement Lakevia reports she is doing better today than last visit. She states she saw her physician and that her back is doing better. She was able to do all of todays exercises requiring verbal encouragment due to pt thinking she couldn't do abduction or bridging exercises. Following todays session she reported no increase in pain and declined any need for heat. plan to progress with CKC strengthening as tolerated.    PT Next Visit Plan review HEP, CKC strengthening, Nu-Step, posture education  Issue home walking program.   PT Home Exercise Plan HEP review   Consulted and Agree with Plan of Care Patient        Problem List Patient Active Problem List   Diagnosis Date Noted  . Lumbar spondylosis 01/31/2015  . Chest pain 02/03/2014  . LSIL (low grade squamous intraepithelial lesion) on Pap smear 12/30/2012  . HIV (human immunodeficiency virus infection) (HCC) 04/27/2012  . Gout 04/27/2012  . Hypertension 04/27/2012  . Diabetes (HCC) 04/27/2012  . Asthma  04/27/2012  . Hypercholesteremia 04/27/2012  . CAD (coronary artery disease) 04/27/2012  . Hx of CABG 04/27/2012   Lulu RidingKristoffer Leamon PT, DPT, LAT, ATC  05/03/2015  12:37 PM     [facility] [address] [place], [place], [address] Phone: [phone]   Fax:  [phone]  Name: Weber Cooksris D Piscitello MRN: [id number] Date of Birth: Nov 03, 1950

## 2015-05-04 ENCOUNTER — Ambulatory Visit
Admission: RE | Admit: 2015-05-04 | Discharge: 2015-05-04 | Disposition: A | Discharge status: Home / Self Care | Payer: Medicare Other | Source: Ambulatory Visit

## 2015-05-04 DIAGNOSIS — Z1231 Encounter for screening mammogram for malignant neoplasm of breast: Secondary | ICD-10-CM

## 2015-05-09 ENCOUNTER — Ambulatory Visit: Payer: Medicare Other | Attending: Physician Assistant | Admitting: Physical Therapy

## 2015-05-09 DIAGNOSIS — M2560 Stiffness of unspecified joint, not elsewhere classified: Secondary | ICD-10-CM | POA: Diagnosis present

## 2015-05-09 DIAGNOSIS — M62830 Muscle spasm of back: Secondary | ICD-10-CM | POA: Insufficient documentation

## 2015-05-09 DIAGNOSIS — M5441 Lumbago with sciatica, right side: Secondary | ICD-10-CM | POA: Insufficient documentation

## 2015-05-09 DIAGNOSIS — M5386 Other specified dorsopathies, lumbar region: Secondary | ICD-10-CM

## 2015-05-09 DIAGNOSIS — R293 Abnormal posture: Secondary | ICD-10-CM | POA: Diagnosis present

## 2015-05-09 DIAGNOSIS — R29898 Other symptoms and signs involving the musculoskeletal system: Secondary | ICD-10-CM | POA: Insufficient documentation

## 2015-05-09 NOTE — Patient Instructions (Signed)
WALKING  Walking is a great form of exercise to increase your strength, endurance and overall fitness.  A walking program can help you start slowly and gradually build endurance as you go.  Everyone's ability is different, so each person's starting point will be different.  You do not have to follow them exactly.  The are just samples. You should simply find out what's right for you and stick to that program.   In the beginning, you'll start off walking 2-3 times a day for short distances.  As you get stronger, you'll be walking further at just 1-2 times per day.  A. You Can Walk For A Certain Length Of Time Each Day    Walk 5 minutes 3 times per day.  Increase 2 minutes every 2 days (3 times per day).  Work up to 25-30 minutes (1-2 times per day).   Example:   Day 1-2 5 minutes 3 times per day   Day 7-8 12 minutes 2-3 times per day   Day 13-14 25 minutes 1-2 times per day  B. You Can Walk For a Certain Distance Each Day     Distance can be substituted for time.    Example:   3 trips to mailbox (at road)   3 trips to corner of block   3 trips around the block  C. Go to local [employer] and use the track.    Walk for distance -  around track  Or time  _____minutes  D. Walk      Please only do the exercises that your therapist has initialed and dated 

## 2015-05-09 NOTE — Therapy (Signed)
[facility] [address] [place], [place], [address] Phone: [phone]   Fax:  [phone]  Physical Therapy Treatment  Patient Details  Name: Katie Mccarthy MRN: [id number] Date of Birth: October 27, 1950 Referring Provider: Sharlet SalinaBenjamin Diddy Md  Encounter Date: 05/09/2015      PT End of Session - 05/09/15 1316    Visit Number 4   Number of Visits 16   Date for PT Re-Evaluation 06/12/15   PT Start Time 1150   PT Stop Time 1240   PT Time Calculation (min) 50 min   Activity Tolerance Patient tolerated treatment well   Behavior During Therapy [facility] for tasks assessed/performed      Past Medical History  Diagnosis Date  . Coronary artery disease   . Hypertension   . Diabetes mellitus without complication (HCC)   . HIV infection (HCC)   . Sciatica   . Blood dyscrasia     hiv    Past Surgical History  Procedure Laterality Date  . Cardiac catheterization    . Coronary artery bypass graft    . Maximum access (mas)posterior lumbar interbody fusion (plif) 1 level N/A 01/31/2015    Procedure: Lumbar four to five Decompression and Posterior Lumbar Interbody Fusion;  Surgeon: Loura HaltBenjamin Jared Ditty, MD;  Location: [facility];  Service: Neurosurgery;  Laterality: N/A;  L4-5 MAXIMUM ACCESS POSTERIOR LUMBAR INTERBODY FUSION    There were no vitals filed for this visit.  Visit Diagnosis:  Right-sided low back pain with right-sided sciatica  Decreased ROM of lumbar spine  Weakness of both legs  Abnormal posture  Muscle spasm of back      Subjective Assessment - 05/09/15 1154    Subjective Exercises help help with pain a little while.  6/10 pain.  Tries to not wear brace in the house.   Currently in Pain? Yes   Pain Score 6    Pain Location Back   Pain Orientation Left;Right   Pain Descriptors / Indicators Aching   Pain Radiating Towards RT to calf   Pain Frequency Constant   Aggravating Factors  sitting too long with back brace  . laying on back   Pain Relieving Factors exercises help.                         [facility] Adult PT Treatment/Exercise - 05/09/15 1200    Ambulation/Gait   Gait Comments home walking program and practice with cues   Lumbar Exercises: Stretches   Active Hamstring Stretch 3 reps;30 seconds   Passive Hamstring Stretch 2 reps;30 seconds  strap   Lower Trunk Rotation 3 reps;20 seconds   Pelvic Tilt --  tilts against door, standing.   Lumbar Exercises: Aerobic   Stationary Bike Nu-Step L4   Manual Therapy   Manual therapy comments Instrument assist to RT gluteal including low back, piriformis                PT Education - 05/09/15 1316    Education provided Yes   Education Details home walking program   Person(s) Educated Patient;Spouse   Methods Explanation;Demonstration;Handout;Verbal cues   Comprehension Verbalized understanding;Returned demonstration          PT Short Term Goals - 05/09/15 1319    PT SHORT TERM GOAL #1   Time 4   Period Weeks   Status On-going   PT SHORT TERM GOAL #2   Title pt will report decreased low back pain to </= 6/10  to assist with therapeutic exercises (05/17/2014)   Baseline varies   Time 4   Period Weeks   Status On-going   PT SHORT TERM GOAL #3   Title pt will be able to exhibit proper trunk posture and lifting and carrying mechanics to reduce low back pain and reinjury (05/17/2014)   Time 4   Status Unable to assess   PT SHORT TERM GOAL #4   Title pt will be able to walk/ standing >/= 8 minutes with </= 6/10 pain to promote safety and endurance (05/17/2014)   Time 4   Period Weeks   Status On-going           PT Long Term Goals - 04/17/15 1736    PT LONG TERM GOAL #1   Title pt will be I with all HEP at discharge (06/12/2015)   Time 8   Period Weeks   Status New   PT LONG TERM GOAL #2   Title pt will improve trunk mobility by >/= 5 degrees in all planes to promote improved moblity for ADLs (06/12/2015)   Time  8   Period Weeks   Status New   PT LONG TERM GOAL #3   Title pt will be able to stand/ walk for >/= 10 minutes with </= 4/10 pain to promote walking / standing endurance for ADLS (06/12/2015)   Time 8   Period Weeks   Status New   PT LONG TERM GOAL #4   Title pt will be able to don/ doff her clothing with </= 4/10 pain without assistance to promote independence with ADLs (06/12/2015)   Time 8   Period Weeks   Status New   PT LONG TERM GOAL #5   Title pt will improve her FOTO score to >/= 45 at discharge to demonstrate improved function (06/12/2015)   Time 8   Period Weeks   Status New               Plan - 05/09/15 1317    Clinical Impression Statement Less leg pain post session.(RT)   Progress toward home exercise goals.  Patient wears brace in community.     PT Next Visit Plan review walking program progress.,  strengthening core/legs   PT Home Exercise Plan walking program to bus stop.   Consulted and Agree with Plan of Care Patient        Problem List Patient Active Problem List   Diagnosis Date Noted  . Lumbar spondylosis 01/31/2015  . Chest pain 02/03/2014  . LSIL (low grade squamous intraepithelial lesion) on Pap smear 12/30/2012  . HIV (human immunodeficiency virus infection) (HCC) 04/27/2012  . Gout 04/27/2012  . Hypertension 04/27/2012  . Diabetes (HCC) 04/27/2012  . Asthma 04/27/2012  . Hypercholesteremia 04/27/2012  . CAD (coronary artery disease) 04/27/2012  . Hx of CABG 04/27/2012    [facility] 05/09/2015, 1:21 PM  [facility] [address] [place], [place], [address] Phone: [phone]   Fax:  [phone]  Name: Katie Mccarthy MRN: [id number] Date of Birth: October 22, 1950    Liz Beach, PTA 05/09/2015 1:21 PM Phone: [phone] Fax: [phone]

## 2015-05-11 ENCOUNTER — Ambulatory Visit: Payer: Medicare Other | Admitting: Physical Therapy

## 2015-05-16 ENCOUNTER — Ambulatory Visit: Payer: Medicare Other | Admitting: Physical Therapy

## 2015-05-18 ENCOUNTER — Ambulatory Visit: Payer: Medicare Other | Admitting: Physical Therapy

## 2015-05-18 DIAGNOSIS — R29898 Other symptoms and signs involving the musculoskeletal system: Secondary | ICD-10-CM

## 2015-05-18 DIAGNOSIS — M5441 Lumbago with sciatica, right side: Secondary | ICD-10-CM | POA: Diagnosis not present

## 2015-05-18 DIAGNOSIS — M5386 Other specified dorsopathies, lumbar region: Secondary | ICD-10-CM

## 2015-05-18 DIAGNOSIS — M62830 Muscle spasm of back: Secondary | ICD-10-CM

## 2015-05-18 DIAGNOSIS — R293 Abnormal posture: Secondary | ICD-10-CM

## 2015-05-18 NOTE — Therapy (Signed)
[facility] [address] [place], [place], [address] Phone: [phone]   Fax:  [phone]  Physical Therapy Treatment  Patient Details  Name: Katie Cooksris D Hilyard MRN: [id number] Date of Birth: 10-29-1950 Referring Provider: Sharlet SalinaBenjamin Diddy Md  Encounter Date: 05/18/2015      PT End of Session - 05/18/15 1040    Visit Number 5   Number of Visits 16   Date for PT Re-Evaluation 06/12/15   PT Start Time 1028  pt arrived 13 minutes late today   PT Stop Time 1116   PT Time Calculation (min) 48 min   Activity Tolerance Patient tolerated treatment well   Behavior During Therapy [facility] for tasks assessed/performed      Past Medical History  Diagnosis Date  . Coronary artery disease   . Hypertension   . Diabetes mellitus without complication (HCC)   . HIV infection (HCC)   . Sciatica   . Blood dyscrasia     hiv    Past Surgical History  Procedure Laterality Date  . Cardiac catheterization    . Coronary artery bypass graft    . Maximum access (mas)posterior lumbar interbody fusion (plif) 1 level N/A 01/31/2015    Procedure: Lumbar four to five Decompression and Posterior Lumbar Interbody Fusion;  Surgeon: Loura HaltBenjamin Jared Ditty, MD;  Location: [facility];  Service: Neurosurgery;  Laterality: N/A;  L4-5 MAXIMUM ACCESS POSTERIOR LUMBAR INTERBODY FUSION    There were no vitals filed for this visit.  Visit Diagnosis:  Right-sided low back pain with right-sided sciatica  Weakness of both legs  Decreased ROM of lumbar spine  Abnormal posture  Muscle spasm of back      Subjective Assessment - 05/18/15 1032    Subjective "the back don't hurt as much but it still fluctuates from time to time" Last night pt reports having a big pain and tightness on the R side of her low back and stated it was hard to get a deep breath in.    Currently in Pain? Yes   Pain Score 4    Pain Location Back   Pain Orientation Left;Right   Pain  Descriptors / Indicators Aching   Pain Type Chronic pain   Pain Onset More than a month ago   Pain Frequency Intermittent   Aggravating Factors  prolongd standing, walking, and sitting,    Pain Relieving Factors exercise                         [facility] Adult PT Treatment/Exercise - 05/18/15 1035    Self-Care   Other Self-Care Comments  educated pt that if she has pain that it would benefit her not to cancel therapy and that we can change therapy to focus on decreasing pain    Lumbar Exercises: Stretches   Lower Trunk Rotation 3 reps;20 seconds   Lumbar Exercises: Aerobic   Stationary Bike Nu-Step L4 x 8 min   Lumbar Exercises: Seated   Long Arc Quad on Chair Both;1 set;10 reps;Weights  seated marching 2 x 20 5#   LAQ on Chair Weights (lbs) 5#   Lumbar Exercises: Supine   Clam 15 reps  2 sets with green theraband   Clam Limitations verbal cueing for 3 sec controlled eccentric lowering   Bridge 10 reps   Moist Heat Therapy   Number Minutes Moist Heat 10 Minutes   Moist Heat Location Lumbar Spine  in supine  PT Education - 05/18/15 1100    Education provided Yes   Education Details re-educated about walking about home, and to come to therapy even if her pain is up that therapy can be adjusted.    Person(s) Educated Patient   Methods Explanation   Comprehension Verbalized understanding          PT Short Term Goals - 05/18/15 1107    PT SHORT TERM GOAL #1   Title pt will be I with inital HEP (05/17/2014)   Time 4   Period Weeks   Status On-going   PT SHORT TERM GOAL #2   Title pt will report decreased low back pain to </= 6/10 to assist with therapeutic exercises (05/17/2014)   Baseline varies   Time 4   Period Weeks   Status On-going   PT SHORT TERM GOAL #3   Title pt will be able to exhibit proper trunk posture and lifting and carrying mechanics to reduce low back pain and reinjury (05/17/2014)   Time 4   Period Weeks   Status  On-going   PT SHORT TERM GOAL #4   Title pt will be able to walk/ standing >/= 8 minutes with </= 6/10 pain to promote safety and endurance (05/17/2014)   Time 4   Period Weeks   Status On-going           PT Long Term Goals - 05/18/15 1151    PT LONG TERM GOAL #1   Title pt will be I with all HEP at discharge (06/12/2015)   Time 8   Period Weeks   Status On-going   PT LONG TERM GOAL #2   Title pt will improve trunk mobility by >/= 5 degrees in all planes to promote improved moblity for ADLs (06/12/2015)   Time 8   Period Weeks   Status On-going   PT LONG TERM GOAL #3   Title pt will be able to stand/ walk for >/= 10 minutes with </= 4/10 pain to promote walking / standing endurance for ADLS (06/12/2015)   Time 8   Period Weeks   Status On-going   PT LONG TERM GOAL #4   Title pt will be able to don/ doff her clothing with </= 4/10 pain without assistance to promote independence with ADLs (06/12/2015)   Time 8   Period Weeks   Status On-going   PT LONG TERM GOAL #5   Title pt will improve her FOTO score to >/= 45 at discharge to demonstrate improved function (06/12/2015)   Time 8   Period Weeks   Status On-going               Plan - 05/18/15 1102    Clinical Impression Statement pt arrived to todays session 13 minutes late. She reports she is doing better and has been doing more walking. She was able to complete todays exercises without mild complaint of soreness but no report of increased pain. Educated pt that even if her pain gets elevated she needs to come to therapy and that therapy can be adjusted, pt demonstrated understanding. Plan to progress with  CKC strengthening activities.    Rehab Potential Good   Consulted and Agree with Plan of Care Patient        Problem List Patient Active Problem List   Diagnosis Date Noted  . Lumbar spondylosis 01/31/2015  . Chest pain 02/03/2014  . LSIL (low grade squamous intraepithelial lesion) on Pap smear 12/30/2012  . HIV  (human  immunodeficiency virus infection) (HCC) 04/27/2012  . Gout 04/27/2012  . Hypertension 04/27/2012  . Diabetes (HCC) 04/27/2012  . Asthma 04/27/2012  . Hypercholesteremia 04/27/2012  . CAD (coronary artery disease) 04/27/2012  . Hx of CABG 04/27/2012   Lulu Riding PT, DPT, LAT, ATC  05/18/2015  11:54 AM     [facility] [address] [place], [place], [address] Phone: [phone]   Fax:  [phone]  Name: Katie Mccarthy MRN: [id number] Date of Birth: 01/12/1951

## 2015-05-23 ENCOUNTER — Ambulatory Visit: Payer: Medicare Other | Admitting: Physical Therapy

## 2015-05-23 DIAGNOSIS — R29898 Other symptoms and signs involving the musculoskeletal system: Secondary | ICD-10-CM

## 2015-05-23 DIAGNOSIS — M5441 Lumbago with sciatica, right side: Secondary | ICD-10-CM | POA: Diagnosis not present

## 2015-05-23 DIAGNOSIS — R293 Abnormal posture: Secondary | ICD-10-CM

## 2015-05-23 DIAGNOSIS — M5386 Other specified dorsopathies, lumbar region: Secondary | ICD-10-CM

## 2015-05-23 NOTE — Therapy (Signed)
[facility] Outpatient Rehabilitation [facility] [address] [place], [place], [address] Phone: [phone]   Fax:  [phone]  Physical Therapy Treatment  Patient Details  Name: Katie Mccarthy MRN: [id number] Date of Birth: 01-14-1951 Referring Provider: Sharlet Salina Diddy Md  Encounter Date: 05/23/2015      PT End of Session - 05/23/15 1447    Visit Number 6   Number of Visits 16   Date for PT Re-Evaluation 06/12/15   PT Start Time 1330   PT Stop Time 1420   PT Time Calculation (min) 50 min   Activity Tolerance Patient tolerated treatment well   Behavior During Therapy [facility] for tasks assessed/performed      Past Medical History  Diagnosis Date  . Coronary artery disease   . Hypertension   . Diabetes mellitus without complication (HCC)   . HIV infection (HCC)   . Sciatica   . Blood dyscrasia     hiv    Past Surgical History  Procedure Laterality Date  . Cardiac catheterization    . Coronary artery bypass graft    . Maximum access (mas)posterior lumbar interbody fusion (plif) 1 level N/A 01/31/2015    Procedure: Lumbar four to five Decompression and Posterior Lumbar Interbody Fusion;  Surgeon: Loura Halt Ditty, MD;  Location: [facility];  Service: Neurosurgery;  Laterality: N/A;  L4-5 MAXIMUM ACCESS POSTERIOR LUMBAR INTERBODY FUSION    There were no vitals filed for this visit.  Visit Diagnosis:  Right-sided low back pain with right-sided sciatica  Weakness of both legs  Decreased ROM of lumbar spine  Abnormal posture      Subjective Assessment - 05/23/15 1338    Subjective Gets stabbing chest oain 1 X a week.  I know it is not my heatr,  9/10.  Back 8/10.  heat got a little hot last visit.  I have had it before it is not my heart.  Walking 20 minutes to and from store with strollerPain 7/10.   Currently in Pain? Yes   Pain Score 8    Pain Location Back   Pain Orientation Left;Right  RT>LT   Pain Descriptors / Indicators Numbness   Pain  Radiating Towards RT to calf   Pain Frequency Constant   Aggravating Factors  walking, standing sitting too long.   Pain Relieving Factors exercise   Multiple Pain Sites --  chest pain nothing makes it worse or better, 9/10, stabbing  gets 1 x aweek                         [facility] Adult PT Treatment/Exercise - 05/23/15 1403    Lumbar Exercises: Standing   Heel Raises 20 reps   Functional Squats 5 reps   Lumbar Exercises: Supine   Ab Set 10 reps   Clam 10 reps  2 sets   Clam Limitations bands   Knee/Hip Exercises: Standing   Hip Abduction 10 reps  each   Abduction Limitations holding chairback.  cues upright posture   Hip Extension 10 reps;1 set;Left;Right   Extension Limitations holding chairbach, cues for upright posture.   Knee/Hip Exercises: Seated   Hamstring Curl 10 reps;2 sets   Hamstring Limitations level 1 bands                PT Education - 05/23/15 1441    Education provided Yes   Education Details hamstring curls, heel lifts, hip extension/hip abduction.   Person(s) Educated Secretary/administrator)   Methods  Explanation;Demonstration;Verbal cues;Handout   Comprehension Verbalized understanding;Returned demonstration          PT Short Term Goals - 05/18/15 1107    PT SHORT TERM GOAL #1   Title pt will be I with inital HEP (05/17/2014)   Time 4   Period Weeks   Status On-going   PT SHORT TERM GOAL #2   Title pt will report decreased low back pain to </= 6/10 to assist with therapeutic exercises (05/17/2014)   Baseline varies   Time 4   Period Weeks   Status On-going   PT SHORT TERM GOAL #3   Title pt will be able to exhibit proper trunk posture and lifting and carrying mechanics to reduce low back pain and reinjury (05/17/2014)   Time 4   Period Weeks   Status On-going   PT SHORT TERM GOAL #4   Title pt will be able to walk/ standing >/= 8 minutes with </= 6/10 pain to promote safety and endurance (05/17/2014)   Time 4   Period  Weeks   Status On-going           PT Long Term Goals - 05/23/15 1450    PT LONG TERM GOAL #1   Title pt will be I with all HEP at discharge (06/12/2015)   Time 8   Period Weeks   Status On-going   PT LONG TERM GOAL #2   Title pt will improve trunk mobility by >/= 5 degrees in all planes to promote improved moblity for ADLs (06/12/2015)   Time 8   Period Weeks   Status Unable to assess   PT LONG TERM GOAL #3   Title pt will be able to stand/ walk for >/= 10 minutes with </= 4/10 pain to promote walking / standing endurance for ADLS (06/12/2015)   Baseline can walk 20 minutes piushing baby carriage.  Pain 8/10   Time 8   Period Weeks   Status On-going   PT LONG TERM GOAL #4   Title 8   Period Weeks   Status Unable to assess   PT LONG TERM GOAL #5   Title pt will improve her FOTO score to >/= 45 at discharge to demonstrate improved function (06/12/2015)   Time 8   Period Weeks   Status Unable to assess               Plan - 05/23/15 1447    Clinical Impression Statement Patient making progress toward function walking 20 minutes pushing carrage to store each way.,  she is weaning from brace..  RT hamstring demonstrates increased strength.   PT Next Visit Plan review new home exercise.   PT Home Exercise Plan hip standing abduction, extension, heel raise, knee flexion band, yellow   Consulted and Agree with Plan of Care Patient        Problem List Patient Active Problem List   Diagnosis Date Noted  . Lumbar spondylosis 01/31/2015  . Chest pain 02/03/2014  . LSIL (low grade squamous intraepithelial lesion) on Pap smear 12/30/2012  . HIV (human immunodeficiency virus infection) (HCC) 04/27/2012  . Gout 04/27/2012  . Hypertension 04/27/2012  . Diabetes (HCC) 04/27/2012  . Asthma 04/27/2012  . Hypercholesteremia 04/27/2012  . CAD (coronary artery disease) 04/27/2012  . Hx of CABG 04/27/2012    [facility] 05/23/2015, 2:52 PM  [facility] [address] [place], [place], [address] Phone: [phone]   Fax:  [phone]  Name: Katie Mccarthy  MRN: [id number] Date of Birth: 01-23-1951    Liz Beach, PTA 05/23/2015 2:52 PM Phone: [phone] Fax: [phone]

## 2015-05-23 NOTE — Patient Instructions (Addendum)
Heel Raise (Calf Strength / Balance)    Stand with support, 0___ lb weights on ankles. Breathe in. Rise up on tiptoes, breathing out through pursed lips. Hold position to count of 1___. Return slowly, breathing in. Repeat _10 to 20 __ times per session. Do_1__ sessions per day.  3- 4 x a week Variation: Do without weights.  Copyright  VHI. All rights reserved.  Hamstring Curl: Resisted (Sitting)    Facing anchor with tubing on right ankle, leg straight out, bend knee. Repeat __10 __ times per set. Do _1 to 3___ sets per session. Do __1__ sessions per day.   3-4 X a week.  Yellow band.  Family member to hold band.  http://orth.exer.us/669   Copyright  VHI. All rights reserved.  EXTENSION: Standing (Active)    Stand, both feet flat. Draw right leg behind body as far as possible. Use _0__ lbs. Complete __1-3_ sets of __10_ repetitions. Perform _1__ sessions per day.  3-4 X a week  http://gtsc.exer.us/77   Copyright  VHI. All rights reserved.  Hip Abduction: Standing - Straight Leg  ABDUCTION: Standing (Active)    Stand, feet flat. Lift right leg out to side. Use _0__ lbs. Complete _1-3__ sets of _10__ repetitions. Perform __1_ sessions per day. 3 to 4 x a week.  http://gtsc.exer.us/111   Copyright  VHI. All rights reserved.    In shoulder width stance, tubing around ankles, pull leg out to side, keeping knee straight. Repeat __ times per set. Repeat with other leg. Do __ sets per session. Do __ sessions per week.  http://tub.exer.us/208   Copyright  VHI. All rights reserved.

## 2015-05-25 ENCOUNTER — Ambulatory Visit: Payer: Medicare Other | Admitting: Physical Therapy

## 2015-05-29 ENCOUNTER — Ambulatory Visit: Payer: Medicare Other | Admitting: Physical Therapy

## 2015-05-29 DIAGNOSIS — M62830 Muscle spasm of back: Secondary | ICD-10-CM

## 2015-05-29 DIAGNOSIS — M5441 Lumbago with sciatica, right side: Secondary | ICD-10-CM | POA: Diagnosis not present

## 2015-05-29 DIAGNOSIS — M5386 Other specified dorsopathies, lumbar region: Secondary | ICD-10-CM

## 2015-05-29 DIAGNOSIS — R293 Abnormal posture: Secondary | ICD-10-CM

## 2015-05-29 DIAGNOSIS — R29898 Other symptoms and signs involving the musculoskeletal system: Secondary | ICD-10-CM

## 2015-05-29 NOTE — Therapy (Signed)
[facility] Outpatient Rehabilitation [facility] [address] [place], [place], [address] Phone: [phone]   Fax:  [phone]  Physical Therapy Treatment  Patient Details  Name: Katie Mccarthy MRN: [id number] Date of Birth: 02/26/1951 Referring Provider: Sharlet Salina Diddy Md  Encounter Date: 05/29/2015      PT End of Session - 05/29/15 1458    Visit Number 7   Number of Visits 16   Date for PT Re-Evaluation 06/12/15   PT Start Time 1330   PT Stop Time 1421   PT Time Calculation (min) 51 min   Activity Tolerance Patient tolerated treatment well   Behavior During Therapy [facility] for tasks assessed/performed      Past Medical History  Diagnosis Date  . Coronary artery disease   . Hypertension   . Diabetes mellitus without complication (HCC)   . HIV infection (HCC)   . Sciatica   . Blood dyscrasia     hiv    Past Surgical History  Procedure Laterality Date  . Cardiac catheterization    . Coronary artery bypass graft    . Maximum access (mas)posterior lumbar interbody fusion (plif) 1 level N/A 01/31/2015    Procedure: Lumbar four to five Decompression and Posterior Lumbar Interbody Fusion;  Surgeon: Loura Halt Ditty, MD;  Location: [facility];  Service: Neurosurgery;  Laterality: N/A;  L4-5 MAXIMUM ACCESS POSTERIOR LUMBAR INTERBODY FUSION    There were no vitals filed for this visit.  Visit Diagnosis:  Right-sided low back pain with right-sided sciatica  Weakness of both legs  Decreased ROM of lumbar spine  Abnormal posture  Muscle spasm of back      Subjective Assessment - 05/29/15 1333    Subjective "I just hurt today" She reports it could be connected to being constipated.    Currently in Pain? Yes   Pain Score 9    Pain Location Back   Pain Orientation Left;Right   Pain Descriptors / Indicators Sharp;Aching   Pain Type Chronic pain   Pain Onset More than a month ago   Pain Frequency Constant   Aggravating Factors  walking, standing,  prolong sitting   Pain Relieving Factors exercise                         [facility] Adult PT Treatment/Exercise - 05/29/15 1350    Self-Care   Other Self-Care Comments  educated on proper mechanics of gretting in and out of bed properly to avoid straining on the back.    Lumbar Exercises: Stretches   Lower Trunk Rotation 3 reps;30 seconds   Pelvic Tilt 5 reps;10 seconds   Lumbar Exercises: Aerobic   Stationary Bike Nu-Step L4 x 8 min   Lumbar Exercises: Standing   Heel Raises 20 reps   Functional Squats 5 reps   Lumbar Exercises: Supine   Ab Set 10 reps   Clam 10 reps  green theraband   Clam Limitations bands   Bent Knee Raise 10 reps   Knee/Hip Exercises: Standing   Hip Abduction 10 reps   Modalities   Modalities Electrical Stimulation   Moist Heat Therapy   Number Minutes Moist Heat 15 Minutes   Moist Heat Location Lumbar Spine  supine   Electrical Stimulation   Electrical Stimulation Location lumbar spine   Electrical Stimulation Action IFC   Electrical Stimulation Parameters 100% scan, Level 6, x 15 min   Electrical Stimulation Goals Pain  PT Short Term Goals - 05/29/15 1555    PT SHORT TERM GOAL #1   Title pt will be I with inital HEP (05/17/2014)   Time 4   Period Weeks   Status On-going   PT SHORT TERM GOAL #2   Title pt will report decreased low back pain to </= 6/10 to assist with therapeutic exercises (05/17/2014)   Time 4   Period Weeks   Status On-going   PT SHORT TERM GOAL #3   Title pt will be able to exhibit proper trunk posture and lifting and carrying mechanics to reduce low back pain and reinjury (05/17/2014)   Time 4   Period Weeks   Status On-going   PT SHORT TERM GOAL #4   Title pt will be able to walk/ standing >/= 8 minutes with </= 6/10 pain to promote safety and endurance (05/17/2014)   Time 4   Period Weeks   Status On-going           PT Long Term Goals - 05/29/15 1555    PT LONG TERM GOAL #1    Title pt will be I with all HEP at discharge (06/12/2015)   Time 8   Period Weeks   Status On-going   PT LONG TERM GOAL #2   Title pt will improve trunk mobility by >/= 5 degrees in all planes to promote improved moblity for ADLs (06/12/2015)   Time 8   Period Weeks   Status Unable to assess   PT LONG TERM GOAL #3   Title pt will be able to stand/ walk for >/= 10 minutes with </= 4/10 pain to promote walking / standing endurance for ADLS (06/12/2015)   Time 8   Period Weeks   Status On-going   PT LONG TERM GOAL #5   Title pt will improve her FOTO score to >/= 45 at discharge to demonstrate improved function (06/12/2015)   Time 8   Period Weeks   Status Unable to assess               Plan - 05/29/15 1459    Clinical Impression Statement Katie Mccarthy reports that her pain is a 9/10 today. Focused todays session on reducing low back pain and doing light exercises. Follwing E-stim and heat she reported some relief and was able to complete the exercises given without complaint of increased  pain . plan to progress with exercises and core strengthening.    PT Next Visit Plan review new home exercise. core strengthening, standing hip strengthening.    Consulted and Agree with Plan of Care Patient        Problem List Patient Active Problem List   Diagnosis Date Noted  . Lumbar spondylosis 01/31/2015  . Chest pain 02/03/2014  . LSIL (low grade squamous intraepithelial lesion) on Pap smear 12/30/2012  . HIV (human immunodeficiency virus infection) (HCC) 04/27/2012  . Gout 04/27/2012  . Hypertension 04/27/2012  . Diabetes (HCC) 04/27/2012  . Asthma 04/27/2012  . Hypercholesteremia 04/27/2012  . CAD (coronary artery disease) 04/27/2012  . Hx of CABG 04/27/2012   Lulu Riding PT, DPT, LAT, ATC  05/29/2015  3:58 PM    [facility] Outpatient Rehabilitation [facility] [address] [place], [place], [address] Phone: [phone]   Fax:  [phone]  Name: Katie Mccarthy MRN: [id number] Date of Birth: Jun 08, 1950

## 2015-05-31 ENCOUNTER — Ambulatory Visit: Payer: Medicare Other | Admitting: Physical Therapy

## 2015-05-31 DIAGNOSIS — M62830 Muscle spasm of back: Secondary | ICD-10-CM

## 2015-05-31 DIAGNOSIS — M5441 Lumbago with sciatica, right side: Secondary | ICD-10-CM | POA: Diagnosis not present

## 2015-05-31 DIAGNOSIS — M5386 Other specified dorsopathies, lumbar region: Secondary | ICD-10-CM

## 2015-05-31 DIAGNOSIS — R29898 Other symptoms and signs involving the musculoskeletal system: Secondary | ICD-10-CM

## 2015-05-31 DIAGNOSIS — R293 Abnormal posture: Secondary | ICD-10-CM

## 2015-05-31 NOTE — Patient Instructions (Signed)
Transversus abdominus hold 5 x 10 in sidelying. Add clam single on side with above. Full instruction sheet issued from exercise drawer.  1 -2 x a day.

## 2015-05-31 NOTE — Therapy (Addendum)
[place], [place], [address] Phone: [phone]   Fax:  [phone]  Physical Therapy Treatment / discharge note  Patient Details  Name: Katie Mccarthy MRN: [id number] Date of Birth: Feb 10, 1951 Referring Provider: Marland Kitchen Diddy Md  Encounter Date: 05/31/2015      PT End of Session - 05/31/15 1758    Visit Number 8   Number of Visits 16   Date for PT Re-Evaluation 06/12/15   PT Start Time 1504   PT Stop Time 1545   PT Time Calculation (min) 41 min   Activity Tolerance Patient tolerated treatment well   Behavior During Therapy [facility] for tasks assessed/performed      Past Medical History  Diagnosis Date  . Coronary artery disease   . Hypertension   . Diabetes mellitus without complication ([place])   . HIV infection ([place])   . Sciatica   . Blood dyscrasia     hiv    Past Surgical History  Procedure Laterality Date  . Cardiac catheterization    . Coronary artery bypass graft    . Maximum access (mas)posterior lumbar interbody fusion (plif) 1 level N/A 01/31/2015    Procedure: Lumbar four to five Decompression and Posterior Lumbar Interbody Fusion;  Surgeon: Kevan Ny Ditty, MD;  Location: [facility];  Service: Neurosurgery;  Laterality: N/A;  L4-5 MAXIMUM ACCESS POSTERIOR LUMBAR INTERBODY FUSION    There were no vitals filed for this visit.  Visit Diagnosis:  Right-sided low back pain with right-sided sciatica  Weakness of both legs  Decreased ROM of lumbar spine  Abnormal posture  Muscle spasm of back      Subjective Assessment - 05/31/15 1515    Subjective 7/10   Currently in Pain? Yes   Pain Score 7    Pain Location Back   Pain Orientation Left;Right   Pain Descriptors / Indicators Aching;Sharp   Pain Radiating Towards Rt leg to calf   Pain Frequency Constant   Aggravating Factors  walking, prolonged stand or sitting.    Pain Relieving Factors exercise,  Brace                           [facility] Adult PT Treatment/Exercise - 05/31/15 0001    Ambulation/Gait   Gait Comments more upright today.   Lumbar Exercises: Aerobic   Stationary Bike 6 minutes unable to keep bike turned on   Lumbar Exercises: Standing   Heel Raises 2 seconds   Functional Squats 10 reps  small movements   Other Standing Lumbar Exercises Hip extension/abduction 10 X each   Lumbar Exercises: Sidelying   Clam 10 reps   Hip Abduction 10 reps   Other Sidelying Lumbar Exercises transversus abdominus    Knee/Hip Exercises: Standing   Heel Raises 10 reps  both   Hip Abduction 10 reps  both alternating   Hip Extension 10 reps  both   Functional Squat 10 reps  shallow, small range                PT Education - 05/31/15 1758    Education provided Yes   Education Details teansversus abdominus    Person(s) Educated Patient;Spouse   Methods Explanation;Tactile cues;Verbal cues;Handout   Comprehension Verbalized understanding;Returned demonstration          PT Short Term Goals - 05/29/15 1555    PT SHORT TERM GOAL #1   Title pt will be I with inital  HEP (05/17/2014)   Time 4   Period Weeks   Status On-going   PT SHORT TERM GOAL #2   Title pt will report decreased low back pain to </= 6/10 to assist with therapeutic exercises (05/17/2014)   Time 4   Period Weeks   Status On-going   PT SHORT TERM GOAL #3   Title pt will be able to exhibit proper trunk posture and lifting and carrying mechanics to reduce low back pain and reinjury (05/17/2014)   Time 4   Period Weeks   Status On-going   PT SHORT TERM GOAL #4   Title pt will be able to walk/ standing >/= 8 minutes with </= 6/10 pain to promote safety and endurance (05/17/2014)   Time 4   Period Weeks   Status On-going           PT Long Term Goals - 05/29/15 1555    PT LONG TERM GOAL #1   Title pt will be I with all HEP at discharge (06/12/2015)   Time 8   Period Weeks   Status On-going    PT LONG TERM GOAL #2   Title pt will improve trunk mobility by >/= 5 degrees in all planes to promote improved moblity for ADLs (06/12/2015)   Time 8   Period Weeks   Status Unable to assess   PT LONG TERM GOAL #3   Title pt will be able to stand/ walk for >/= 10 minutes with </= 4/10 pain to promote walking / standing endurance for ADLS (11/11/2954)   Time 8   Period Weeks   Status On-going   PT LONG TERM GOAL #5   Title pt will improve her FOTO score to >/= 45 at discharge to demonstrate improved function (06/12/2015)   Time 8   Period Weeks   Status Unable to assess        G-code: changing and maintaining position Clinical judgement: Goal status CK Discharge status CL       Plan - 05/31/15 1759    Clinical Impression Statement decreased pain with exercise to 6/10. ABLE TO TRY leg press today,  Walking more in community for exercise.     PT Next Visit Plan leg press, minisquat, step ups/downs.  Hip abduction   PT Home Exercise Plan transversus abdominus   Consulted and Agree with Plan of Care Family member/caregiver   Family Member Consulted spouse        Problem List Patient Active Problem List   Diagnosis Date Noted  . Lumbar spondylosis 01/31/2015  . Chest pain 02/03/2014  . LSIL (low grade squamous intraepithelial lesion) on Pap smear 12/30/2012  . HIV (human immunodeficiency virus infection) ([place]) 04/27/2012  . Gout 04/27/2012  . Hypertension 04/27/2012  . Diabetes ([place]) 04/27/2012  . Asthma 04/27/2012  . Hypercholesteremia 04/27/2012  . CAD (coronary artery disease) 04/27/2012  . Hx of CABG 04/27/2012    [facility] 05/31/2015, 6:01 PM  [facility] [address] [place], [place], [address] Phone: [phone]   Fax:  [phone]  Name: TENESSA MARSEE MRN: [id number] Date of Birth: 1951-03-15    Melvenia Needles, PTA 05/31/2015 6:01 PM Phone: [phone] Fax: [phone]   PHYSICAL THERAPY  DISCHARGE SUMMARY  Visits from Start of Care: 8  Current functional level related to goals / functional outcomes: See goals   Remaining deficits: Unknown due to not returning   Education / Equipment: HEP  Plan: Patient agrees to discharge.  Patient goals were  not met. Patient is being discharged due to not returning since the last visit.  ?????        Kristoffer Leamon PT, DPT, LAT, ATC  07/12/2015  3:10 PM

## 2015-06-05 ENCOUNTER — Ambulatory Visit: Payer: Medicare Other | Admitting: Physical Therapy

## 2015-06-06 ENCOUNTER — Ambulatory Visit (INDEPENDENT_AMBULATORY_CARE_PROVIDER_SITE_OTHER): Payer: Medicare Other | Admitting: Internal Medicine

## 2015-06-06 ENCOUNTER — Encounter: Payer: Self-pay | Admitting: Internal Medicine

## 2015-06-06 VITALS — BP 115/77 | HR 70 | Temp 98.3°F | Wt 198.0 lb

## 2015-06-06 DIAGNOSIS — M549 Dorsalgia, unspecified: Secondary | ICD-10-CM | POA: Diagnosis not present

## 2015-06-06 DIAGNOSIS — R87612 Low grade squamous intraepithelial lesion on cytologic smear of cervix (LGSIL): Secondary | ICD-10-CM | POA: Diagnosis not present

## 2015-06-06 DIAGNOSIS — K5909 Other constipation: Secondary | ICD-10-CM

## 2015-06-06 DIAGNOSIS — B20 Human immunodeficiency virus [HIV] disease: Secondary | ICD-10-CM

## 2015-06-06 DIAGNOSIS — G8929 Other chronic pain: Secondary | ICD-10-CM

## 2015-06-06 LAB — CBC WITH DIFFERENTIAL/PLATELET
BASOS ABS: 0 10*3/uL (ref 0.0–0.1)
Basophils Relative: 0 % (ref 0–1)
EOS ABS: 0.1 10*3/uL (ref 0.0–0.7)
EOS PCT: 2 % (ref 0–5)
HEMATOCRIT: 37 % (ref 36.0–46.0)
Hemoglobin: 12 g/dL (ref 12.0–15.0)
LYMPHS ABS: 2 10*3/uL (ref 0.7–4.0)
LYMPHS PCT: 39 % (ref 12–46)
MCH: 27.8 pg (ref 26.0–34.0)
MCHC: 32.4 g/dL (ref 30.0–36.0)
MCV: 85.8 fL (ref 78.0–100.0)
MONO ABS: 0.3 10*3/uL (ref 0.1–1.0)
MPV: 8.6 fL (ref 8.6–12.4)
Monocytes Relative: 5 % (ref 3–12)
Neutro Abs: 2.8 10*3/uL (ref 1.7–7.7)
Neutrophils Relative %: 54 % (ref 43–77)
PLATELETS: 268 10*3/uL (ref 150–400)
RBC: 4.31 MIL/uL (ref 3.87–5.11)
RDW: 16.4 % — AB (ref 11.5–15.5)
WBC: 5.2 10*3/uL (ref 4.0–10.5)

## 2015-06-06 LAB — COMPLETE METABOLIC PANEL WITH GFR
ALT: 8 U/L (ref 6–29)
AST: 12 U/L (ref 10–35)
Albumin: 3.6 g/dL (ref 3.6–5.1)
Alkaline Phosphatase: 65 U/L (ref 33–130)
BUN: 11 mg/dL (ref 7–25)
CALCIUM: 9.6 mg/dL (ref 8.6–10.4)
CHLORIDE: 106 mmol/L (ref 98–110)
CO2: 22 mmol/L (ref 20–31)
Creat: 1.11 mg/dL — ABNORMAL HIGH (ref 0.50–0.99)
GFR, EST AFRICAN AMERICAN: 61 mL/min (ref >=60)
GFR, EST NON AFRICAN AMERICAN: 53 mL/min — AB (ref >=60)
Glucose, Bld: 135 mg/dL — ABNORMAL HIGH (ref 65–99)
POTASSIUM: 4 mmol/L (ref 3.5–5.3)
Sodium: 141 mmol/L (ref 135–146)
Total Bilirubin: 0.5 mg/dL (ref 0.2–1.2)
Total Protein: 6.8 g/dL (ref 6.1–8.1)

## 2015-06-06 MED ORDER — OXYCODONE-ACETAMINOPHEN 5-325 MG PO TABS: 1.0000 | ORAL_TABLET | Freq: Three times a day (TID) | ORAL | Status: DC | PRN

## 2015-06-06 NOTE — Progress Notes (Signed)
Patient ID: Katie Mccarthy, female   DOB: March 11, 1951, 65 y.o.   MRN: [id number]       Patient ID: Katie Mccarthy, female   DOB: [birth year], 65 y.o.   MRN: [id number]  HPI Katie Mccarthy is a 65yo F with HIV disease, Cd 4 count of 790/VL<20 on triumeq. She still suffers from low back pain despite back surgery in the Fall, though also coincides with worsening acute on chronic constipation.she has last BM 10 days ago, usually has BM every 5 days. She takes prune juice only every other day  Outpatient Encounter Prescriptions as of 06/06/2015  Medication Sig  . albuterol (PROVENTIL HFA;VENTOLIN HFA) 108 (90 BASE) MCG/ACT inhaler Inhale into the lungs every 6 (six) hours as needed for wheezing or shortness of breath.  Marland Kitchen amLODipine (NORVASC) 10 MG tablet Take 10 mg by mouth daily.  Marland Kitchen aspirin 81 MG chewable tablet Chew 81 mg by mouth daily.   Marland Kitchen atorvastatin (LIPITOR) 40 MG tablet Take 40 mg by mouth daily.  . carvedilol (COREG) 12.5 MG tablet TAKE 1 TABLET BY MOUTH TWICE DAILY WITH A MEAL  . carvedilol (COREG) 12.5 MG tablet TAKE 1 TABLET BY MOUTH TWICE DAILY WITH A MEAL  . cloNIDine (CATAPRES) 0.1 MG tablet Take 0.1 mg by mouth 2 (two) times daily.  Marland Kitchen gabapentin (NEURONTIN) 300 MG capsule Take 1 capsule (300 mg total) by mouth 3 (three) times daily. Start with 1 tab daily x 5 days; then 1 tab twice/day x 5 days;then 3x/day (Patient taking differently: Take 300 mg by mouth every evening. )  . isosorbide mononitrate (IMDUR) 60 MG 24 hr tablet Take 60 mg by mouth daily.  Marland Kitchen lisinopril (PRINIVIL,ZESTRIL) 40 MG tablet TAKE 1 TABLET BY MOUTH EVERY DAY  . metFORMIN (GLUCOPHAGE) 500 MG tablet Take 500 mg by mouth daily with breakfast.  . methocarbamol (ROBAXIN) 500 MG tablet Take 1.5 tablets (750 mg total) by mouth every 6 (six) hours as needed for muscle spasms.  . nitroGLYCERIN (NITROSTAT) 0.4 MG SL tablet Place 0.4 mg under the tongue every 5 (five) minutes as needed for chest pain.  Marland Kitchen oxyCODONE-acetaminophen  (PERCOCET/ROXICET) 5-325 MG tablet Take 1 tablet by mouth every 8 (eight) hours as needed for moderate pain.  . pantoprazole (PROTONIX) 40 MG tablet Take 1 tablet (40 mg total) by mouth daily.  . prasugrel (EFFIENT) 10 MG TABS tablet Take 10 mg by mouth daily.  Marland Kitchen terbinafine (LAMISIL) 250 MG tablet Take 250 mg by mouth daily.  . TRIUMEQ 600-50-300 MG tablet TAKE 1 TABLET BY MOUTH EVERY NIGHT AT BEDTIME   No facility-administered encounter medications on file as of 06/06/2015.     Patient Active Problem List   Diagnosis Date Noted  . HIV (human immunodeficiency virus infection) (HCC) 04/27/2012    Priority: High  . Hypertension 04/27/2012    Priority: Medium  . Diabetes (HCC) 04/27/2012    Priority: Medium  . Hypercholesteremia 04/27/2012    Priority: Medium  . Lumbar spondylosis 01/31/2015  . Chest pain 02/03/2014  . LSIL (low grade squamous intraepithelial lesion) on Pap smear 12/30/2012  . Gout 04/27/2012  . Asthma 04/27/2012  . CAD (coronary artery disease) 04/27/2012  . 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 FOOT EXAM  01/25/1962  . OPHTHALMOLOGY EXAM  01/25/1962  . COLONOSCOPY  01/25/2002  . ZOSTAVAX  01/26/2012  . PNEUMOCOCCAL POLYSACCHARIDE VACCINE (2) 03/06/2014     Review of Systems 10 point  ros is negative except for what is mentioned in hpi Physical Exam   BP 115/77 mmHg  Pulse 70  Temp(Src) 98.3 F (36.8 C) (Oral)  Wt 198 lb (89.812 kg) Physical Exam  Constitutional:  oriented to person, place, and time. appears well-developed and well-nourished. No distress.  HENT: [place]/AT, PERRLA, no scleral icterus Mouth/Throat: Oropharynx is clear and moist. No oropharyngeal exudate.  Cardiovascular: Normal rate, regular rhythm and normal heart sounds. Exam reveals no gallop and no friction rub.  No murmur heard.  Pulmonary/Chest: Effort normal and breath sounds normal. No respiratory distress.  has no wheezes.  Neck = supple, no nuchal  rigidity Abdominal: Soft. Bowel sounds are normal.  exhibits no distension. There is no tenderness.  Lymphadenopathy: no cervical adenopathy. No axillary adenopathy Neurological: alert and oriented to person, place, and time.  Skin: Skin is warm and dry. No rash noted. No erythema.  Psychiatric: a normal mood and affect.  behavior is normal.   Lab Results  Component Value Date   CD4TCELL 37 11/04/2014   Lab Results  Component Value Date   CD4TABS 790 11/04/2014   CD4TABS 680 03/08/2014   CD4TABS 720 12/15/2013   Lab Results  Component Value Date   HIV1RNAQUANT <20 11/04/2014   Lab Results  Component Value Date   HEPBSAB NEG 03/08/2014   No results found for: RPR  CBC Lab Results  Component Value Date   WBC 8.9 02/01/2015   RBC 4.07 02/01/2015   HGB 11.2* 02/01/2015   HCT 34.6* 02/01/2015   PLT 281 02/01/2015   MCV 85.0 02/01/2015   MCH 27.5 02/01/2015   MCHC 32.4 02/01/2015   RDW 14.0 02/01/2015   LYMPHSABS 2.3 08/15/2014   MONOABS 0.7 08/15/2014   EOSABS 0.2 08/15/2014   BASOSABS 0.0 08/15/2014   BMET Lab Results  Component Value Date   NA 134* 02/01/2015   K 4.9 02/01/2015   CL 102 02/01/2015   CO2 23 02/01/2015   GLUCOSE 124* 02/01/2015   BUN 15 02/01/2015   CREATININE 1.10* 02/01/2015   CALCIUM 8.9 02/01/2015   GFRNONAA 52* 02/01/2015   GFRAA >60 02/01/2015     Assessment and Plan  Chronic constipation = will start colace  bid. Also will have her start miralax. Continue with daily prune juice. May also do mag citrate since it worked in the past  Back pain = gave refill x 1 for opiate pain regimen to use as neede  HIV disease= well controlled, continue on current regimen. Will check labs today  LSIL = she had pap 2 wk ago with results being normal

## 2015-06-07 ENCOUNTER — Ambulatory Visit: Payer: Medicare Other | Attending: Physician Assistant | Admitting: Physical Therapy

## 2015-06-07 LAB — HIV-1 RNA QUANT-NO REFLEX-BLD
HIV 1 RNA Quant: <20 copies/mL (ref <20)
HIV-1 RNA Quant, Log: <1.3 Log copies/mL (ref <1.30)

## 2015-06-07 LAB — T-HELPER CELL (CD4) - (RCID CLINIC ONLY)
CD4 % Helper T Cell: 37 % (ref 33–55)
CD4 T Cell Abs: 750 /uL (ref 400–2700)

## 2015-06-12 ENCOUNTER — Ambulatory Visit: Payer: Medicare Other | Admitting: Physical Therapy

## 2015-06-14 ENCOUNTER — Ambulatory Visit: Payer: Medicare Other | Admitting: Physical Therapy

## 2015-07-05 ENCOUNTER — Ambulatory Visit: Payer: Medicare Other | Attending: Physician Assistant | Admitting: Physical Therapy

## 2015-07-05 DIAGNOSIS — M62830 Muscle spasm of back: Secondary | ICD-10-CM | POA: Insufficient documentation

## 2015-07-05 DIAGNOSIS — R293 Abnormal posture: Secondary | ICD-10-CM | POA: Insufficient documentation

## 2015-07-05 DIAGNOSIS — R29898 Other symptoms and signs involving the musculoskeletal system: Secondary | ICD-10-CM | POA: Insufficient documentation

## 2015-07-05 DIAGNOSIS — M5441 Lumbago with sciatica, right side: Secondary | ICD-10-CM | POA: Insufficient documentation

## 2015-07-05 DIAGNOSIS — M2560 Stiffness of unspecified joint, not elsewhere classified: Secondary | ICD-10-CM | POA: Insufficient documentation

## 2015-07-06 ENCOUNTER — Telehealth: Payer: Self-pay | Admitting: *Deleted

## 2015-07-06 NOTE — Telephone Encounter (Signed)
Patient calling to request refill of percocet. Please advise if this is to be a monthly refill.  If so, will need a pain contract. Andree Coss, RN

## 2015-07-06 NOTE — Telephone Encounter (Signed)
Can do monthly refill and start pain contract

## 2015-07-11 ENCOUNTER — Other Ambulatory Visit: Payer: Self-pay | Admitting: *Deleted

## 2015-07-11 ENCOUNTER — Encounter: Payer: Self-pay | Admitting: *Deleted

## 2015-07-11 DIAGNOSIS — M549 Dorsalgia, unspecified: Principal | ICD-10-CM

## 2015-07-11 DIAGNOSIS — G8929 Other chronic pain: Secondary | ICD-10-CM

## 2015-07-11 MED ORDER — OXYCODONE-ACETAMINOPHEN 5-325 MG PO TABS: 1.0000 | ORAL_TABLET | Freq: Three times a day (TID) | ORAL | Status: AC | PRN

## 2015-07-11 NOTE — Telephone Encounter (Signed)
Error

## 2015-07-11 NOTE — Telephone Encounter (Signed)
Per verbal from Dr Drue SecondSnider called the patient to advise her that she can get a Rx for the Percocet and will have to sign a pain contract. Dr Drue SecondSnider will refer her out to a pain clinic for long term care but in the mean time we will refill the medication until she gets an appt. Had to leave a message for he to call the office when she gets the message to explain this to her.

## 2015-07-11 NOTE — Telephone Encounter (Signed)
Patient returned call and I explained what Dr Stanford BreedSniders orders are and she verbalized understanding. She also advised she already has an appt set for pain management on Friday 07/14/15. Advised will let the doctor know.

## 2015-07-12 DIAGNOSIS — M2560 Stiffness of unspecified joint, not elsewhere classified: Secondary | ICD-10-CM | POA: Diagnosis present

## 2015-07-12 DIAGNOSIS — R29898 Other symptoms and signs involving the musculoskeletal system: Secondary | ICD-10-CM | POA: Diagnosis present

## 2015-07-12 DIAGNOSIS — R293 Abnormal posture: Secondary | ICD-10-CM | POA: Diagnosis present

## 2015-07-12 DIAGNOSIS — M62830 Muscle spasm of back: Secondary | ICD-10-CM | POA: Diagnosis present

## 2015-07-12 DIAGNOSIS — M5441 Lumbago with sciatica, right side: Secondary | ICD-10-CM | POA: Diagnosis present

## 2015-07-14 ENCOUNTER — Other Ambulatory Visit: Payer: Self-pay | Admitting: Internal Medicine

## 2015-07-14 DIAGNOSIS — I10 Essential (primary) hypertension: Secondary | ICD-10-CM

## 2015-08-11 ENCOUNTER — Other Ambulatory Visit: Payer: Self-pay | Admitting: Internal Medicine

## 2015-09-08 ENCOUNTER — Other Ambulatory Visit: Payer: Self-pay | Admitting: Infectious Diseases

## 2015-09-11 ENCOUNTER — Other Ambulatory Visit: Payer: Self-pay | Admitting: *Deleted

## 2015-09-11 DIAGNOSIS — B20 Human immunodeficiency virus [HIV] disease: Secondary | ICD-10-CM

## 2015-09-11 MED ORDER — ABACAVIR-DOLUTEGRAVIR-LAMIVUD 600-50-300 MG PO TABS: 1.0000 | ORAL_TABLET | Freq: Every day | ORAL | Status: DC

## 2015-10-06 ENCOUNTER — Other Ambulatory Visit: Payer: Self-pay | Admitting: Internal Medicine

## 2015-10-06 DIAGNOSIS — I10 Essential (primary) hypertension: Secondary | ICD-10-CM

## 2015-12-04 ENCOUNTER — Other Ambulatory Visit: Payer: Self-pay | Admitting: *Deleted

## 2015-12-04 DIAGNOSIS — Z113 Encounter for screening for infections with a predominantly sexual mode of transmission: Secondary | ICD-10-CM

## 2015-12-04 DIAGNOSIS — Z79899 Other long term (current) drug therapy: Secondary | ICD-10-CM

## 2015-12-04 DIAGNOSIS — B20 Human immunodeficiency virus [HIV] disease: Secondary | ICD-10-CM

## 2016-01-04 ENCOUNTER — Ambulatory Visit: Payer: Medicare Other

## 2016-01-04 DIAGNOSIS — Z113 Encounter for screening for infections with a predominantly sexual mode of transmission: Secondary | ICD-10-CM

## 2016-01-04 DIAGNOSIS — B20 Human immunodeficiency virus [HIV] disease: Secondary | ICD-10-CM

## 2016-01-04 DIAGNOSIS — Z79899 Other long term (current) drug therapy: Secondary | ICD-10-CM

## 2016-01-04 LAB — CBC WITH DIFFERENTIAL/PLATELET
BASOS PCT: 0 %
Basophils Absolute: 0 cells/uL (ref 0–200)
EOS ABS: 104 {cells}/uL (ref 15–500)
Eosinophils Relative: 2 %
HCT: 39.5 % (ref 35.0–45.0)
Hemoglobin: 12.8 g/dL (ref 11.7–15.5)
LYMPHS PCT: 43 %
Lymphs Abs: 2236 cells/uL (ref 850–3900)
MCH: 28.2 pg (ref 27.0–33.0)
MCHC: 32.4 g/dL (ref 32.0–36.0)
MCV: 87 fL (ref 80.0–100.0)
MONOS PCT: 5 %
MPV: 8.8 fL (ref 7.5–12.5)
Monocytes Absolute: 260 cells/uL (ref 200–950)
NEUTROS PCT: 50 %
Neutro Abs: 2600 cells/uL (ref 1500–7800)
PLATELETS: 275 10*3/uL (ref 140–400)
RBC: 4.54 MIL/uL (ref 3.80–5.10)
RDW: 14.8 % (ref 11.0–15.0)
WBC: 5.2 10*3/uL (ref 3.8–10.8)

## 2016-01-04 LAB — COMPLETE METABOLIC PANEL WITH GFR
ALBUMIN: 4 g/dL (ref 3.6–5.1)
ALK PHOS: 73 U/L (ref 33–130)
ALT: 9 U/L (ref 6–29)
AST: 18 U/L (ref 10–35)
BILIRUBIN TOTAL: 0.6 mg/dL (ref 0.2–1.2)
BUN: 19 mg/dL (ref 7–25)
CALCIUM: 9.8 mg/dL (ref 8.6–10.4)
CO2: 23 mmol/L (ref 20–31)
Chloride: 106 mmol/L (ref 98–110)
Creat: 1.4 mg/dL — ABNORMAL HIGH (ref 0.50–0.99)
GFR, EST AFRICAN AMERICAN: 46 mL/min — AB (ref >=60)
GFR, EST NON AFRICAN AMERICAN: 40 mL/min — AB (ref >=60)
Glucose, Bld: 129 mg/dL — ABNORMAL HIGH (ref 65–99)
POTASSIUM: 5.2 mmol/L (ref 3.5–5.3)
SODIUM: 141 mmol/L (ref 135–146)
Total Protein: 7.3 g/dL (ref 6.1–8.1)

## 2016-01-04 LAB — LIPID PANEL
Cholesterol: 220 mg/dL — ABNORMAL HIGH (ref 125–200)
HDL: 65 mg/dL (ref >=46)
LDL CALC: 126 mg/dL (ref <130)
TRIGLYCERIDES: 147 mg/dL (ref <150)
Total CHOL/HDL Ratio: 3.4 Ratio (ref <=5.0)
VLDL: 29 mg/dL (ref <30)

## 2016-01-05 LAB — HIV-1 RNA QUANT-NO REFLEX-BLD
HIV 1 RNA Quant: <20 copies/mL (ref <20)
HIV-1 RNA Quant, Log: <1.3 Log copies/mL (ref <1.30)

## 2016-01-05 LAB — RPR

## 2016-01-05 LAB — T-HELPER CELL (CD4) - (RCID CLINIC ONLY)
CD4 T CELL ABS: 890 /uL (ref 400–2700)
CD4 T CELL HELPER: 41 % (ref 33–55)

## 2016-01-18 ENCOUNTER — Ambulatory Visit (INDEPENDENT_AMBULATORY_CARE_PROVIDER_SITE_OTHER): Payer: Medicare Other | Admitting: Internal Medicine

## 2016-01-18 ENCOUNTER — Encounter: Payer: Self-pay | Admitting: Internal Medicine

## 2016-01-18 VITALS — BP 76/50 | HR 80 | Temp 97.4°F | Wt 203.0 lb

## 2016-01-18 DIAGNOSIS — I951 Orthostatic hypotension: Secondary | ICD-10-CM

## 2016-01-18 DIAGNOSIS — B20 Human immunodeficiency virus [HIV] disease: Secondary | ICD-10-CM

## 2016-01-18 DIAGNOSIS — Z79899 Other long term (current) drug therapy: Secondary | ICD-10-CM

## 2016-01-18 DIAGNOSIS — Z113 Encounter for screening for infections with a predominantly sexual mode of transmission: Secondary | ICD-10-CM

## 2016-01-18 LAB — BASIC METABOLIC PANEL
BUN: 14 mg/dL (ref 7–25)
CALCIUM: 9.6 mg/dL (ref 8.6–10.4)
CHLORIDE: 108 mmol/L (ref 98–110)
CO2: 22 mmol/L (ref 20–31)
CREATININE: 1.18 mg/dL — AB (ref 0.50–0.99)
Glucose, Bld: 149 mg/dL — ABNORMAL HIGH (ref 65–99)
POTASSIUM: 5 mmol/L (ref 3.5–5.3)
Sodium: 139 mmol/L (ref 135–146)

## 2016-01-18 MED ORDER — SODIUM CHLORIDE 0.45 % IV BOLUS: 1000.0000 mL | Freq: Once | INTRAVENOUS | Status: DC

## 2016-01-18 NOTE — Patient Instructions (Signed)
-   stop taking norvasc for now until you see your PCP  - check your blood pressure every morning and write down the value to show your PCP (after you take your BP medication)  - please see your PCP in the next 5-7 days

## 2016-01-18 NOTE — Progress Notes (Signed)
RFV: follow up for hiv disease  Patient ID: Katie Mccarthy, female   DOB: [birth year], 65 y.o.   MRN: [id number]  HPI Katie Mccarthy is a 65yo F with history of well controlled HIV disease, HTN, CAD, DM, low back pain. She states that she has been in good health, felt alittle dizzy yesterday but drank roughly 4 glasses of water. Vitals in clinic showed sBP 80, manual repeat bp was sbp 84. She denies significant ligtheadedness. She took all her BP meds this morning already. Had little oral intake this morning  Outpatient Encounter Prescriptions as of 01/18/2016  Medication Sig  . abacavir-dolutegravir-lamiVUDine (TRIUMEQ) 600-50-300 MG tablet Take 1 tablet by mouth at bedtime.  Marland Kitchen albuterol (PROVENTIL HFA;VENTOLIN HFA) 108 (90 BASE) MCG/ACT inhaler Inhale into the lungs every 6 (six) hours as needed for wheezing or shortness of breath.  Marland Kitchen amLODipine (NORVASC) 10 MG tablet Take 10 mg by mouth daily.  Marland Kitchen aspirin 81 MG chewable tablet Chew 81 mg by mouth daily.   Marland Kitchen atorvastatin (LIPITOR) 40 MG tablet Take 40 mg by mouth daily.  . carvedilol (COREG) 12.5 MG tablet TAKE 1 TABLET BY MOUTH TWICE DAILY WITH A MEAL  . cloNIDine (CATAPRES) 0.1 MG tablet Take 0.1 mg by mouth 2 (two) times daily.  Marland Kitchen gabapentin (NEURONTIN) 300 MG capsule Take 1 capsule (300 mg total) by mouth 3 (three) times daily. Start with 1 tab daily x 5 days; then 1 tab twice/day x 5 days;then 3x/day (Patient taking differently: Take 300 mg by mouth every evening. )  . isosorbide mononitrate (IMDUR) 60 MG 24 hr tablet Take 60 mg by mouth daily.  Marland Kitchen lisinopril (PRINIVIL,ZESTRIL) 40 MG tablet TAKE 1 TABLET BY MOUTH EVERY DAY  . metFORMIN (GLUCOPHAGE) 500 MG tablet Take 500 mg by mouth daily with breakfast.  . methocarbamol (ROBAXIN) 500 MG tablet Take 1.5 tablets (750 mg total) by mouth every 6 (six) hours as needed for muscle spasms.  . pantoprazole (PROTONIX) 40 MG tablet Take 1 tablet (40 mg total) by mouth daily.  . prasugrel (EFFIENT) 10 MG TABS  tablet Take 10 mg by mouth daily.  . nitroGLYCERIN (NITROSTAT) 0.4 MG SL tablet Place 0.4 mg under the tongue every 5 (five) minutes as needed for chest pain.  Marland Kitchen oxyCODONE-acetaminophen (PERCOCET/ROXICET) 5-325 MG tablet Take 1 tablet by mouth every 8 (eight) hours as needed for moderate pain. (Patient not taking: Reported on 01/18/2016)  . terbinafine (LAMISIL) 250 MG tablet Take 250 mg by mouth daily.   No facility-administered encounter medications on file as of 01/18/2016.      Patient Active Problem List   Diagnosis Date Noted  . HIV (human immunodeficiency virus infection) (HCC) 04/27/2012    Priority: High  . Hypertension 04/27/2012    Priority: Medium  . Diabetes (HCC) 04/27/2012    Priority: Medium  . Hypercholesteremia 04/27/2012    Priority: Medium  . Lumbar spondylosis 01/31/2015  . Chest pain 02/03/2014  . LSIL (low grade squamous intraepithelial lesion) on Pap smear 12/30/2012  . Gout 04/27/2012  . Asthma 04/27/2012  . CAD (coronary artery disease) 04/27/2012  . 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 FOOT EXAM  01/25/1962  . OPHTHALMOLOGY EXAM  01/25/1962  . COLONOSCOPY  01/25/2002  . ZOSTAVAX  01/26/2012  . PNEUMOCOCCAL POLYSACCHARIDE VACCINE (2) 03/06/2014  . HEMOGLOBIN A1C  07/26/2015  . INFLUENZA VACCINE  12/05/2015  . PAP SMEAR  12/29/2015  Review of Systems + dizziness yesterday. Otherwise no + ROS. Physical Exam   BP (!) 80/55   Pulse 74   Temp 97.4 F (36.3 C) (Oral)   Wt 203 lb (92.1 kg)   SpO2 98%   BMI 31.33 kg/m  Constitutional:  oriented to person, place, and time. appears well-developed and well-nourished. No distress.  HENT: [place]/AT, PERRLA, no scleral icterus Mouth/Throat: Oropharynx is clear and moist. No oropharyngeal exudate.  Cardiovascular: Normal rate, regular rhythm and normal heart sounds. Exam reveals no gallop and no friction rub.  No murmur heard.  Pulmonary/Chest: Effort normal and breath  sounds normal. No respiratory distress.  has no wheezes.  Neck = supple, no nuchal rigidity Abdominal: Soft. Bowel sounds are normal.  exhibits no distension. There is no tenderness.  Lymphadenopathy: no cervical adenopathy. No axillary adenopathy Neurological: alert and oriented to person, place, and time.  Skin: Skin is warm and dry. No rash noted. No erythema.  Psychiatric: a normal mood and affect.  behavior is normal.   Lab Results  Component Value Date   CD4TCELL 41 01/04/2016   Lab Results  Component Value Date   CD4TABS 890 01/04/2016   CD4TABS 750 06/06/2015   CD4TABS 790 11/04/2014   Lab Results  Component Value Date   HIV1RNAQUANT <20 01/04/2016   Lab Results  Component Value Date   HEPBSAB NEG 03/08/2014   No results found for: RPR  CBC Lab Results  Component Value Date   WBC 5.2 01/04/2016   RBC 4.54 01/04/2016   HGB 12.8 01/04/2016   HCT 39.5 01/04/2016   PLT 275 01/04/2016   MCV 87.0 01/04/2016   MCH 28.2 01/04/2016   MCHC 32.4 01/04/2016   RDW 14.8 01/04/2016   LYMPHSABS 2,236 01/04/2016   MONOABS 260 01/04/2016   EOSABS 104 01/04/2016   BASOSABS 0 01/04/2016   BMET Lab Results  Component Value Date   NA 141 01/04/2016   K 5.2 01/04/2016   CL 106 01/04/2016   CO2 23 01/04/2016   GLUCOSE 129 (H) 01/04/2016   BUN 19 01/04/2016   CREATININE 1.40 (H) 01/04/2016   CALCIUM 9.8 01/04/2016   GFRNONAA 40 (L) 01/04/2016   GFRAA 46 (L) 01/04/2016     Assessment and Plan  Hypotension =surprisingly not as symptomatic as one would expect.  - will give 1L IV fluids in clinic  aki = repeat bmp to check kidney function  htn = may not need as much BP needed. recommend to stop amlodipine. Continue on coreg and clonidine until she sees PCP. Take BP recordings daily  Dental caries = Needs dental follow up  HIV disease = continue on genvoya

## 2016-01-19 MED ORDER — SODIUM CHLORIDE 0.9 % IV SOLN: Freq: Once | INTRAVENOUS | Status: AC

## 2016-03-08 ENCOUNTER — Other Ambulatory Visit: Payer: Self-pay | Admitting: Internal Medicine

## 2016-03-08 DIAGNOSIS — B20 Human immunodeficiency virus [HIV] disease: Secondary | ICD-10-CM

## 2016-03-08 DIAGNOSIS — I10 Essential (primary) hypertension: Secondary | ICD-10-CM

## 2016-04-01 ENCOUNTER — Other Ambulatory Visit: Payer: Self-pay | Admitting: Internal Medicine

## 2016-04-01 DIAGNOSIS — B20 Human immunodeficiency virus [HIV] disease: Secondary | ICD-10-CM

## 2016-04-05 ENCOUNTER — Other Ambulatory Visit: Payer: Self-pay | Admitting: Internal Medicine

## 2016-04-05 DIAGNOSIS — I10 Essential (primary) hypertension: Secondary | ICD-10-CM

## 2016-05-03 ENCOUNTER — Other Ambulatory Visit: Payer: Self-pay | Admitting: Internal Medicine

## 2016-05-03 DIAGNOSIS — I10 Essential (primary) hypertension: Secondary | ICD-10-CM

## 2016-06-18 ENCOUNTER — Emergency Department (HOSPITAL_COMMUNITY)
Admission: EM | Admit: 2016-06-18 | Discharge: 2016-06-18 | Disposition: A | Discharge status: Home / Self Care | Payer: Medicare Other | Attending: Emergency Medicine | Admitting: Emergency Medicine

## 2016-06-18 ENCOUNTER — Encounter (HOSPITAL_COMMUNITY): Payer: Self-pay

## 2016-06-18 DIAGNOSIS — I10 Essential (primary) hypertension: Secondary | ICD-10-CM | POA: Insufficient documentation

## 2016-06-18 DIAGNOSIS — H578 Other specified disorders of eye and adnexa: Secondary | ICD-10-CM | POA: Diagnosis present

## 2016-06-18 DIAGNOSIS — I2510 Atherosclerotic heart disease of native coronary artery without angina pectoris: Secondary | ICD-10-CM | POA: Diagnosis not present

## 2016-06-18 DIAGNOSIS — H1032 Unspecified acute conjunctivitis, left eye: Secondary | ICD-10-CM | POA: Diagnosis not present

## 2016-06-18 DIAGNOSIS — E119 Type 2 diabetes mellitus without complications: Secondary | ICD-10-CM | POA: Diagnosis not present

## 2016-06-18 DIAGNOSIS — Z87891 Personal history of nicotine dependence: Secondary | ICD-10-CM | POA: Diagnosis not present

## 2016-06-18 DIAGNOSIS — Z951 Presence of aortocoronary bypass graft: Secondary | ICD-10-CM | POA: Diagnosis not present

## 2016-06-18 DIAGNOSIS — Z7982 Long term (current) use of aspirin: Secondary | ICD-10-CM | POA: Diagnosis not present

## 2016-06-18 DIAGNOSIS — J45909 Unspecified asthma, uncomplicated: Secondary | ICD-10-CM | POA: Diagnosis not present

## 2016-06-18 DIAGNOSIS — Z7984 Long term (current) use of oral hypoglycemic drugs: Secondary | ICD-10-CM | POA: Insufficient documentation

## 2016-06-18 DIAGNOSIS — Z79899 Other long term (current) drug therapy: Secondary | ICD-10-CM | POA: Insufficient documentation

## 2016-06-18 MED ORDER — TETRACAINE HCL 0.5 % OP SOLN
2.0000 [drp] | Freq: Once | OPHTHALMIC | Status: AC
  Administered 2016-06-18: 2 [drp] via OPHTHALMIC
  Filled 2016-06-18: qty 2

## 2016-06-18 MED ORDER — FLUORESCEIN SODIUM 0.6 MG OP STRP
1.0000 | ORAL_STRIP | Freq: Once | OPHTHALMIC | Status: AC
  Administered 2016-06-18: 1 via OPHTHALMIC
  Filled 2016-06-18: qty 1

## 2016-06-18 MED ORDER — ERYTHROMYCIN 5 MG/GM OP OINT: 1.0000 "application " | TOPICAL_OINTMENT | Freq: Four times a day (QID) | OPHTHALMIC | 0 refills | Status: AC

## 2016-06-18 NOTE — ED Notes (Signed)
ED Provider at bedside. 

## 2016-06-18 NOTE — ED Triage Notes (Signed)
Patient complains of left eye redness, drainage and itching, pain since am, denies trauma. No difficulty seeing, denies trauma

## 2016-06-18 NOTE — Discharge Instructions (Signed)
Conjunctivitis is very contagious. Try to avoid rubbing eyes. Apply warm or cool compresses and use prescribed eye ointments as directed. May consider over the counter antihistamines like zyrtec or claritin to help with symptoms.   Follow up with your ophthalmologist in 1-2 days. Return to the ER for changes or worsening symptoms.

## 2016-06-18 NOTE — ED Provider Notes (Signed)
[facility]ENCY DEPT Provider Note   CSN: [id number] Arrival date & time: 06/18/16  4098     History   Chief Complaint Chief Complaint  Patient presents with  . left eye redness    HPI Katie Mccarthy is a 66 y.o. female with a PMHx of HIV, DM2, HTN, sciatica, gout, HLD, and CAD s/p CABG, who presents to the ED with complaints of left eye redness, foreign body gritty sensation, and watery yellowish drainage that began this morning after she woke up. She states that initially her pain was 10/10 constant nonradiating left eye foreign body gritty sensation pain but has currently mostly resolved, worse with blinking, and with no treatments or alleviating factors noted. She states that 3 weeks ago she was around someone who had pinkeye, but no recent sick contacts. Denies recent swimming, injury to the eye, or contact lens wearing. She denies that her eyes were crusted together this morning when she woke up. She denies any vision changes, eye itchiness, fevers, chills, CP, SOB, abd pain, N/V/D/C, hematuria, dysuria, myalgias, arthralgias, numbness, tingling, focal weakness, rashes, or any other complaints at this time.    The history is provided by the patient and medical records. No language interpreter was used.  Eye Problem   This is a new problem. The current episode started 3 to 5 hours ago. The problem occurs constantly. The problem has been rapidly improving. There is a problem in the left eye. There was no injury mechanism. The pain is at a severity of 10/10. The pain is moderate. There is no history of trauma to the eye. There is known exposure (3wks ago; none recently) to pink eye. She does not wear contacts. Associated symptoms include discharge (watery yellowish), foreign body sensation and eye redness. Pertinent negatives include no numbness, no blurred vision, no decreased vision, no double vision, no photophobia, no nausea, no vomiting, no tingling, no weakness and no itching. She has  tried nothing for the symptoms. The treatment provided no relief.    Past Medical History:  Diagnosis Date  . Blood dyscrasia    hiv  . Coronary artery disease   . Diabetes mellitus without complication (HCC)   . HIV infection (HCC)   . Hypertension   . Sciatica     Patient Active Problem List   Diagnosis Date Noted  . Lumbar spondylosis 01/31/2015  . Chest pain 02/03/2014  . LSIL (low grade squamous intraepithelial lesion) on Pap smear 12/30/2012  . HIV (human immunodeficiency virus infection) (HCC) 04/27/2012  . Gout 04/27/2012  . Hypertension 04/27/2012  . Diabetes (HCC) 04/27/2012  . Asthma 04/27/2012  . Hypercholesteremia 04/27/2012  . CAD (coronary artery disease) 04/27/2012  . Hx of CABG 04/27/2012    Past Surgical History:  Procedure Laterality Date  . CARDIAC CATHETERIZATION    . CORONARY ARTERY BYPASS GRAFT    . MAXIMUM ACCESS (MAS)POSTERIOR LUMBAR INTERBODY FUSION (PLIF) 1 LEVEL N/A 01/31/2015   Procedure: Lumbar four to five Decompression and Posterior Lumbar Interbody Fusion;  Surgeon: Loura Halt Ditty, MD;  Location: [facility];  Service: Neurosurgery;  Laterality: N/A;  L4-5 MAXIMUM ACCESS POSTERIOR LUMBAR INTERBODY FUSION    OB History    Gravida Para Term Preterm AB Living   3 0 0 0 3 0   SAB TAB Ectopic Multiple Live Births   3 0 0 0         Home Medications    Prior to Admission medications   Medication Sig Start Date End  Date Taking? Authorizing Provider  albuterol (PROVENTIL HFA;VENTOLIN HFA) 108 (90 BASE) MCG/ACT inhaler Inhale into the lungs every 6 (six) hours as needed for wheezing or shortness of breath.    Historical Provider, MD  amLODipine (NORVASC) 10 MG tablet Take 10 mg by mouth daily.    Historical Provider, MD  aspirin 81 MG chewable tablet Chew 81 mg by mouth daily.     Historical Provider, MD  atorvastatin (LIPITOR) 40 MG tablet Take 40 mg by mouth daily.    Historical Provider, MD  carvedilol (COREG) 12.5 MG tablet TAKE 1  TABLET BY MOUTH TWICE DAILY WITH A MEAL 05/07/16   Judyann Munson, MD  cloNIDine (CATAPRES) 0.1 MG tablet Take 0.1 mg by mouth 2 (two) times daily.    Historical Provider, MD  gabapentin (NEURONTIN) 300 MG capsule Take 1 capsule (300 mg total) by mouth 3 (three) times daily. Start with 1 tab daily x 5 days; then 1 tab twice/day x 5 days;then 3x/day Patient taking differently: Take 300 mg by mouth every evening.  11/21/14   Judyann Munson, MD  isosorbide mononitrate (IMDUR) 60 MG 24 hr tablet Take 60 mg by mouth daily.    Historical Provider, MD  lisinopril (PRINIVIL,ZESTRIL) 40 MG tablet TAKE 1 TABLET BY MOUTH EVERY DAY 04/08/16   Judyann Munson, MD  metFORMIN (GLUCOPHAGE) 500 MG tablet Take 500 mg by mouth daily with breakfast.    Historical Provider, MD  methocarbamol (ROBAXIN) 500 MG tablet Take 1.5 tablets (750 mg total) by mouth every 6 (six) hours as needed for muscle spasms. 02/01/15   Loura Halt Ditty, MD  nitroGLYCERIN (NITROSTAT) 0.4 MG SL tablet Place 0.4 mg under the tongue every 5 (five) minutes as needed for chest pain.    Historical Provider, MD  oxyCODONE-acetaminophen (PERCOCET/ROXICET) 5-325 MG tablet Take 1 tablet by mouth every 8 (eight) hours as needed for moderate pain. Patient not taking: Reported on 01/18/2016 07/11/15   Judyann Munson, MD  pantoprazole (PROTONIX) 40 MG tablet Take 1 tablet (40 mg total) by mouth daily. 02/04/14   Stark Bray, MD  prasugrel (EFFIENT) 10 MG TABS tablet Take 10 mg by mouth daily.    Historical Provider, MD  terbinafine (LAMISIL) 250 MG tablet Take 250 mg by mouth daily.    Historical Provider, MD  TRIUMEQ 600-50-300 MG tablet TAKE 1 TABLET BY MOUTH EVERY NIGHT AT BEDTIME 04/02/16   Judyann Munson, MD    Family History Family History  Problem Relation Age of Onset  . Diabetes Mother   . Heart disease Mother   . Diabetes Father   . Coronary artery disease Father   . Diabetes Sister   . Diabetes Brother     Social History Social History   Substance Use Topics  . Smoking status: Former Smoker    Types: Cigarettes    Quit date: 11/23/2010  . Smokeless tobacco: Never Used  . Alcohol use No     Allergies   Morphine and related and Rosuvastatin   Review of Systems Review of Systems  Constitutional: Negative for chills and fever.  Eyes: Positive for pain (gritty FB sensation), discharge (watery yellowish) and redness. Negative for blurred vision, double vision, photophobia, itching and visual disturbance.  Respiratory: Negative for shortness of breath.   Cardiovascular: Negative for chest pain.  Gastrointestinal: Negative for abdominal pain, constipation, diarrhea, nausea and vomiting.  Genitourinary: Negative for dysuria and hematuria.  Musculoskeletal: Negative for arthralgias and myalgias.  Skin: Negative for color change and itching.  Allergic/Immunologic: Positive for immunocompromised state (DM2, HIV).  Neurological: Negative for tingling, weakness and numbness.  Psychiatric/Behavioral: Negative for confusion.   10 Systems reviewed and are negative for acute change except as noted in the HPI.   Physical Exam Updated Vital Signs BP 139/96 (BP Location: Right Arm)   Pulse 78   Temp 98 F (36.7 C) (Oral)   Resp 18   SpO2 99%   Physical Exam  Constitutional: She is oriented to person, place, and time. Vital signs are normal. She appears well-developed and well-nourished.  Non-toxic appearance. No distress.  Afebrile, nontoxic, NAD  HENT:  Head: Normocephalic and atraumatic.  Mouth/Throat: Mucous membranes are normal.  Eyes: EOM are normal. Pupils are equal, round, and reactive to light. Lids are everted and swept, no foreign bodies found. Right eye exhibits no discharge. Left eye exhibits discharge (watery). No foreign body present in the left eye. Right conjunctiva is not injected. Right conjunctiva has no hemorrhage. Left conjunctiva is injected. Left conjunctiva has no hemorrhage.  Slit lamp exam:       The left eye shows no corneal abrasion, no corneal flare, no corneal ulcer, no foreign body, no hyphema, no hypopyon and no fluorescein uptake.  PERRL, EOMI, no nystagmus, no visual field deficits, L conjunctiva injected with watery drainage but no crusting or purulent drainage. Lids everted and swept with no FBs noted. Fluorescein stain of L eye reveals no ulcerations or uptake, no corneal flare or abrasions noted. No hyphema or hypopyon.   Neck: Normal range of motion. Neck supple.  Cardiovascular: Normal rate and intact distal pulses.   Pulmonary/Chest: Effort normal. No respiratory distress.  Abdominal: Normal appearance. She exhibits no distension.  Musculoskeletal: Normal range of motion.  Neurological: She is alert and oriented to person, place, and time. She has normal strength. No sensory deficit.  Skin: Skin is warm, dry and intact. No rash noted.  Psychiatric: She has a normal mood and affect. Her behavior is normal.  Nursing note and vitals reviewed.    ED Treatments / Results  Labs (all labs ordered are listed, but only abnormal results are displayed) Labs Reviewed - No data to display  EKG  EKG Interpretation None       Radiology No results found.  Procedures Procedures (including critical care time)  Medications Ordered in ED Medications  fluorescein ophthalmic strip 1 strip (1 strip Left Eye Given 06/18/16 1145)  tetracaine (PONTOCAINE) 0.5 % ophthalmic solution 2 drop (2 drops Left Eye Given 06/18/16 1144)     Initial Impression / Assessment and Plan / ED Course  I have reviewed the triage vital signs and the nursing notes.  Pertinent labs & imaging results that were available during my care of the patient were reviewed by me and considered in my medical decision making (see chart for details).     66 y.o. female here with L eye redness, watery yellowish drainage, and FB sensation this morning after waking up. On exam, mildly injected conjunctiva, no FBs  noted, scant watery drainage noted, no crusting; EOMI, PERRL, fluorescein stain reveals no uptake, no flares/abrasions. No ulcerations, doubt viral etiology, doubt glaucoma or other emergent etiologies. Will treat for bacterial conjunctivitis empirically, discussed cool compresses and antihistamines, and f/up with ophthalmology in 1-2 days for recheck. I explained the diagnosis and have given explicit precautions to return to the ER including for any other new or worsening symptoms. The patient understands and accepts the medical plan as it's been dictated and I have  answered their questions. Discharge instructions concerning home care and prescriptions have been given. The patient is STABLE and is discharged to home in good condition.   Final Clinical Impressions(s) / ED Diagnoses   Final diagnoses:  Acute conjunctivitis of left eye, unspecified acute conjunctivitis type    New Prescriptions New Prescriptions   ERYTHROMYCIN OPHTHALMIC OINTMENT    Place 1 application into the left eye 4 (four) times daily. Apply thin 1/2 inch ribbon to lower eyelid every 6 hours x 7 days     [address] PA-C 06/18/16 1147    Melene Plan, DO 06/18/16 1157

## 2016-11-15 ENCOUNTER — Other Ambulatory Visit: Payer: Self-pay | Admitting: Internal Medicine

## 2016-11-15 DIAGNOSIS — I10 Essential (primary) hypertension: Secondary | ICD-10-CM

## 2017-02-15 IMAGING — RF DG LUMBAR SPINE 2-3V
1 series · 2 of 2 positions shown · non-contrast
Comparison: MRI 01/19/2015

CLINICAL DATA: L4-5 fusion.

EXAM:
DG C-ARM GT 120 MIN; LUMBAR SPINE - 2-3 VIEW

[Series 1: run · 2 of 2 slices shown]
[im 1/2]
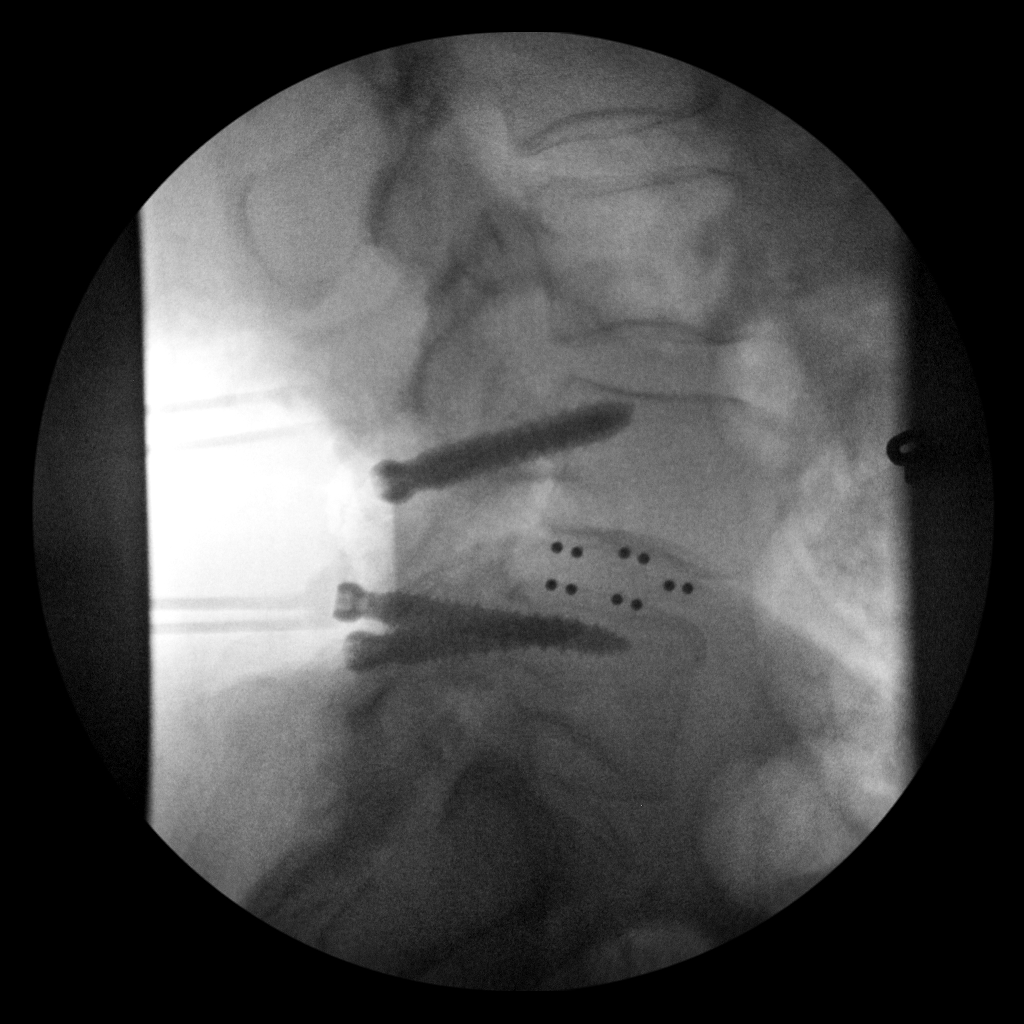
[im 2/2]
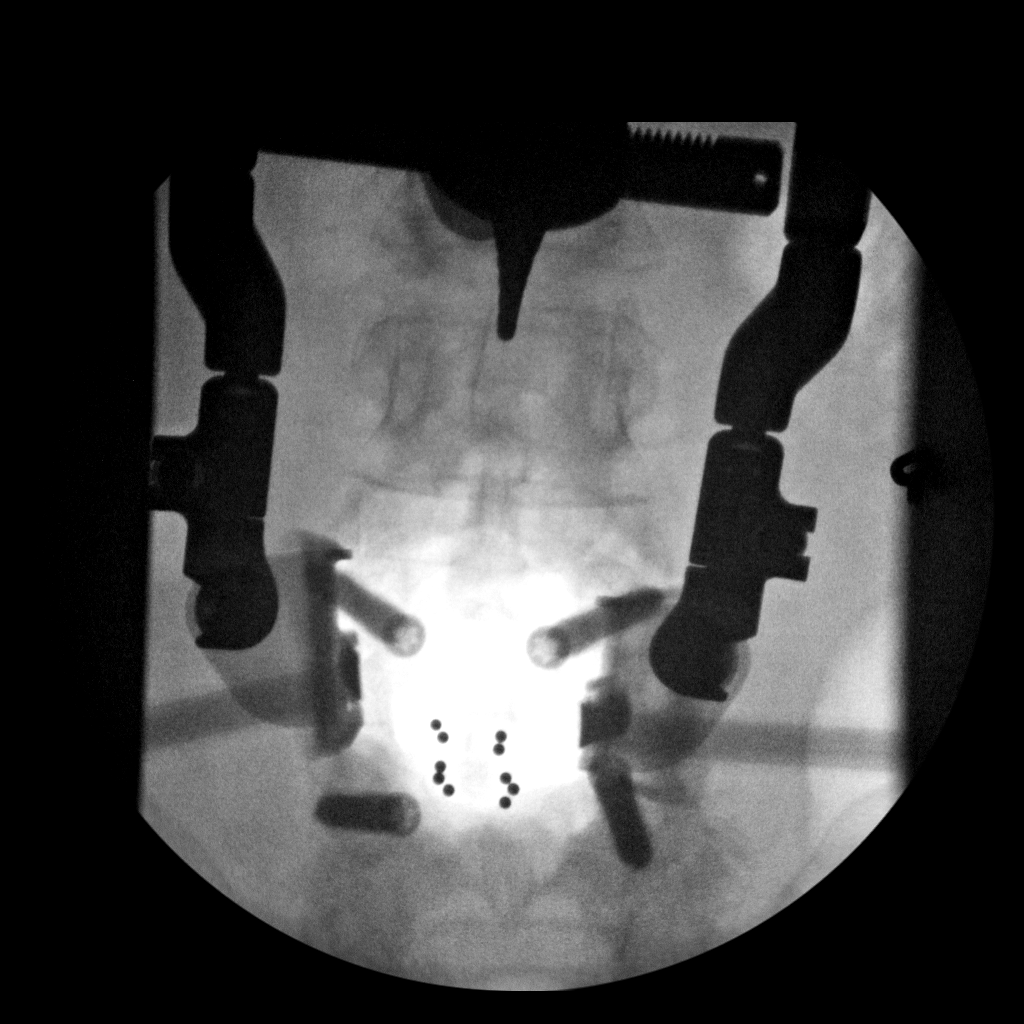

[2 of 2 positions shown; findings below may reference images not displayed]

FINDINGS: Fluoroscopic spot films demonstrate pedicle screws and interbody
bone spacer at L4-5. No complicating features are demonstrated.
IMPRESSION: Fusion hardware in good position at L4-5 without complicating
features.

## 2017-02-19 ENCOUNTER — Other Ambulatory Visit: Payer: Self-pay | Admitting: Internal Medicine

## 2017-02-19 DIAGNOSIS — B20 Human immunodeficiency virus [HIV] disease: Secondary | ICD-10-CM

## 2017-02-25 NOTE — Addendum Note (Signed)
Addended by: Jennet MaduroESTRIDGE, Aleiyah Halpin D on: 02/25/2017 04:38 PM   Modules accepted: Orders

## 2017-03-18 ENCOUNTER — Other Ambulatory Visit: Payer: Self-pay | Admitting: Internal Medicine

## 2017-03-18 DIAGNOSIS — B20 Human immunodeficiency virus [HIV] disease: Secondary | ICD-10-CM

## 2017-03-20 ENCOUNTER — Other Ambulatory Visit: Payer: Medicare Other

## 2017-03-20 DIAGNOSIS — Z113 Encounter for screening for infections with a predominantly sexual mode of transmission: Secondary | ICD-10-CM

## 2017-03-20 DIAGNOSIS — B20 Human immunodeficiency virus [HIV] disease: Secondary | ICD-10-CM

## 2017-03-20 DIAGNOSIS — Z79899 Other long term (current) drug therapy: Secondary | ICD-10-CM

## 2017-03-21 LAB — COMPREHENSIVE METABOLIC PANEL
AG Ratio: 1.4 (calc) (ref 1.0–2.5)
ALBUMIN MSPROF: 4.1 g/dL (ref 3.6–5.1)
ALKALINE PHOSPHATASE (APISO): 79 U/L (ref 33–130)
ALT: 7 U/L (ref 6–29)
AST: 12 U/L (ref 10–35)
BILIRUBIN TOTAL: 0.4 mg/dL (ref 0.2–1.2)
BUN/Creatinine Ratio: 16 (calc) (ref 6–22)
BUN: 17 mg/dL (ref 7–25)
CALCIUM: 9.9 mg/dL (ref 8.6–10.4)
CHLORIDE: 106 mmol/L (ref 98–110)
CO2: 23 mmol/L (ref 20–32)
Creat: 1.05 mg/dL — ABNORMAL HIGH (ref 0.50–0.99)
Globulin: 3 g/dL (calc) (ref 1.9–3.7)
Glucose, Bld: 119 mg/dL — ABNORMAL HIGH (ref 65–99)
POTASSIUM: 4.7 mmol/L (ref 3.5–5.3)
Sodium: 140 mmol/L (ref 135–146)
Total Protein: 7.1 g/dL (ref 6.1–8.1)

## 2017-03-21 LAB — CBC WITH DIFFERENTIAL/PLATELET
BASOS ABS: 28 {cells}/uL (ref 0–200)
Basophils Relative: 0.5 %
EOS ABS: 149 {cells}/uL (ref 15–500)
Eosinophils Relative: 2.7 %
HEMATOCRIT: 36 % (ref 35.0–45.0)
HEMOGLOBIN: 11.8 g/dL (ref 11.7–15.5)
LYMPHS ABS: 2310 {cells}/uL (ref 850–3900)
MCH: 28.6 pg (ref 27.0–33.0)
MCHC: 32.8 g/dL (ref 32.0–36.0)
MCV: 87.2 fL (ref 80.0–100.0)
MPV: 9.3 fL (ref 7.5–12.5)
Monocytes Relative: 10.5 %
NEUTROS ABS: 2437 {cells}/uL (ref 1500–7800)
Neutrophils Relative %: 44.3 %
Platelets: 336 10*3/uL (ref 140–400)
RBC: 4.13 10*6/uL (ref 3.80–5.10)
RDW: 14.1 % (ref 11.0–15.0)
Total Lymphocyte: 42 %
WBC mixed population: 578 cells/uL (ref 200–950)
WBC: 5.5 10*3/uL (ref 3.8–10.8)

## 2017-03-21 LAB — LIPID PANEL
CHOLESTEROL: 164 mg/dL (ref <200)
HDL: 65 mg/dL (ref >50)
LDL CHOLESTEROL (CALC): 79 mg/dL
Non-HDL Cholesterol (Calc): 99 mg/dL (calc) (ref <130)
Total CHOL/HDL Ratio: 2.5 (calc) (ref <5.0)
Triglycerides: 116 mg/dL (ref <150)

## 2017-03-21 LAB — RPR: RPR: NONREACTIVE

## 2017-03-21 LAB — T-HELPER CELL (CD4) - (RCID CLINIC ONLY)
CD4 T CELL ABS: 990 /uL (ref 400–2700)
CD4 T CELL HELPER: 40 % (ref 33–55)

## 2017-03-24 LAB — HIV-1 RNA QUANT-NO REFLEX-BLD
HIV 1 RNA Quant: <20 copies/mL
HIV-1 RNA QUANT, LOG: NOT DETECTED {Log_copies}/mL

## 2017-04-08 ENCOUNTER — Other Ambulatory Visit: Payer: Self-pay | Admitting: Internal Medicine

## 2017-04-08 ENCOUNTER — Other Ambulatory Visit: Payer: Self-pay | Admitting: *Deleted

## 2017-04-08 ENCOUNTER — Ambulatory Visit (INDEPENDENT_AMBULATORY_CARE_PROVIDER_SITE_OTHER): Payer: Medicare Other | Admitting: Internal Medicine

## 2017-04-08 ENCOUNTER — Encounter: Payer: Self-pay | Admitting: Internal Medicine

## 2017-04-08 VITALS — BP 131/86 | HR 92 | Temp 98.4°F | Wt 213.0 lb

## 2017-04-08 DIAGNOSIS — Z1231 Encounter for screening mammogram for malignant neoplasm of breast: Secondary | ICD-10-CM

## 2017-04-08 DIAGNOSIS — G8929 Other chronic pain: Secondary | ICD-10-CM

## 2017-04-08 DIAGNOSIS — M5440 Lumbago with sciatica, unspecified side: Secondary | ICD-10-CM | POA: Diagnosis not present

## 2017-04-08 DIAGNOSIS — B20 Human immunodeficiency virus [HIV] disease: Secondary | ICD-10-CM | POA: Diagnosis not present

## 2017-04-08 MED ORDER — ABACAVIR-DOLUTEGRAVIR-LAMIVUD 600-50-300 MG PO TABS: 1.0000 | ORAL_TABLET | Freq: Every day | ORAL | 11 refills | Status: AC

## 2017-04-11 NOTE — Progress Notes (Signed)
RFV: follow up on hiv disease  Patient ID: Katie Mccarthy, female   DOB: Dec 24, 1950, 66 y.o.   MRN: [id number]  HPI Angelique BlonderDenise is a 66yo F with CARD, HTN, HLD, DJD s/p back surgery,and well controlled HIV disease, CD 4 count 990/VL<20, on triumeq. She reports doing well overall. No recent illnesses. She states that she still has some difficulty with back pain, which is chronic. Not worsening. Otherwise, no fevers, chills, nightsweats.  Outpatient Encounter Medications as of 04/08/2017  Medication Sig  . abacavir-dolutegravir-lamiVUDine (TRIUMEQ) 600-50-300 MG tablet Take 1 tablet by mouth at bedtime.  Marland Kitchen. albuterol (PROVENTIL HFA;VENTOLIN HFA) 108 (90 BASE) MCG/ACT inhaler Inhale into the lungs every 6 (six) hours as needed for wheezing or shortness of breath.  Marland Kitchen. amLODipine (NORVASC) 10 MG tablet Take 10 mg by mouth daily.  Marland Kitchen. aspirin 81 MG chewable tablet Chew 81 mg by mouth daily.   Marland Kitchen. atorvastatin (LIPITOR) 40 MG tablet Take 40 mg by mouth daily.  . carvedilol (COREG) 12.5 MG tablet TAKE 1 TABLET BY MOUTH TWICE DAILY WITH A MEAL  . cloNIDine (CATAPRES) 0.1 MG tablet Take 0.1 mg by mouth 2 (two) times daily.  Marland Kitchen. gabapentin (NEURONTIN) 300 MG capsule Take 1 capsule (300 mg total) by mouth 3 (three) times daily. Start with 1 tab daily x 5 days; then 1 tab twice/day x 5 days;then 3x/day (Patient taking differently: Take 300 mg by mouth every evening. )  . isosorbide mononitrate (IMDUR) 60 MG 24 hr tablet Take 60 mg by mouth daily.  Marland Kitchen. lisinopril (PRINIVIL,ZESTRIL) 40 MG tablet TAKE 1 TABLET BY MOUTH EVERY DAY.  . metFORMIN (GLUCOPHAGE) 500 MG tablet Take 500 mg by mouth daily with breakfast.  . methocarbamol (ROBAXIN) 500 MG tablet Take 1.5 tablets (750 mg total) by mouth every 6 (six) hours as needed for muscle spasms.  . nitroGLYCERIN (NITROSTAT) 0.4 MG SL tablet Place 0.4 mg under the tongue every 5 (five) minutes as needed for chest pain.  . pantoprazole (PROTONIX) 40 MG tablet Take 1 tablet (40 mg  total) by mouth daily.  Marland Kitchen. terbinafine (LAMISIL) 250 MG tablet Take 250 mg by mouth daily.  . [DISCONTINUED] TRIUMEQ 600-50-300 MG tablet TAKE 1 TABLET BY MOUTH EVERY NIGHT AT BEDTIME  . acetaminophen (TYLENOL) 500 MG tablet Take 500 mg by mouth every 6 (six) hours as needed for mild pain.  Marland Kitchen. oxyCODONE-acetaminophen (PERCOCET/ROXICET) 5-325 MG tablet Take 1 tablet by mouth every 8 (eight) hours as needed for moderate pain. (Patient not taking: Reported on 01/18/2016)  . prasugrel (EFFIENT) 10 MG TABS tablet Take 10 mg by mouth daily.   Facility-Administered Encounter Medications as of 04/08/2017  Medication  . 0.9 %  sodium chloride infusion     Patient Active Problem List   Diagnosis Date Noted  . HIV (human immunodeficiency virus infection) (HCC) 04/27/2012    Priority: High  . Hypertension 04/27/2012    Priority: Medium  . Diabetes (HCC) 04/27/2012    Priority: Medium  . Hypercholesteremia 04/27/2012    Priority: Medium  . Lumbar spondylosis 01/31/2015  . Chest pain 02/03/2014  . LSIL (low grade squamous intraepithelial lesion) on Pap smear 12/30/2012  . Gout 04/27/2012  . Asthma 04/27/2012  . CAD (coronary artery disease) 04/27/2012  . 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 FOOT EXAM  01/25/1962  . OPHTHALMOLOGY EXAM  01/25/1962  . COLONOSCOPY  01/25/2002  . HEMOGLOBIN A1C  07/26/2015  . PAP  SMEAR  12/29/2015  . DEXA SCAN  01/25/2017  . PNA vac Low Risk Adult (1 of 2 - PCV13) 01/25/2017     Review of Systems Review of Systems  Constitutional: Negative for fever, chills, diaphoresis, activity change, appetite change, fatigue and unexpected weight change.  HENT: Negative for congestion, sore throat, rhinorrhea, sneezing, trouble swallowing and sinus pressure.  Eyes: Negative for photophobia and visual disturbance.  Respiratory: Negative for cough, chest tightness, shortness of breath, wheezing and stridor.  Cardiovascular: Negative for  chest pain, palpitations and leg swelling.  Gastrointestinal: Negative for nausea, vomiting, abdominal pain, diarrhea, constipation, blood in stool, abdominal distention and anal bleeding.  Genitourinary: Negative for dysuria, hematuria, flank pain and difficulty urinating.  Musculoskeletal:+ low back pain-unchanged. Negative for myalgias, back pain, joint swelling, arthralgias and gait problem.  Skin: Negative for color change, pallor, rash and wound.  Neurological: Negative for dizziness, tremors, weakness and light-headedness.  Hematological: Negative for adenopathy. Does not bruise/bleed easily.  Psychiatric/Behavioral: Negative for behavioral problems, confusion, sleep disturbance, dysphoric mood, decreased concentration and agitation.    Physical Exam   BP 131/86   Pulse 92   Temp 98.4 F (36.9 C)   Wt 213 lb (96.6 kg)   BMI 32.87 kg/m   Physical Exam  Constitutional:  oriented to person, place, and time. appears well-developed and well-nourished. No distress.  HENT: [place]/AT, PERRLA, no scleral icterus Mouth/Throat: Oropharynx is clear and moist. No oropharyngeal exudate.  Cardiovascular: Normal rate, regular rhythm and normal heart sounds. Exam reveals no gallop and no friction rub.  No murmur heard.  Pulmonary/Chest: Effort normal and breath sounds normal. No respiratory distress.  has no wheezes.  Neck = supple, no nuchal rigidity Abdominal: Soft. Bowel sounds are normal.  exhibits no distension. There is no tenderness.  Lymphadenopathy: no cervical adenopathy. No axillary adenopathy Neurological: alert and oriented to person, place, and time.  Skin: Skin is warm and dry. No rash noted. No erythema.  Psychiatric: a normal mood and affect.  behavior is normal.   Lab Results  Component Value Date   CD4TCELL 40 03/20/2017   Lab Results  Component Value Date   CD4TABS 990 03/20/2017   CD4TABS 890 01/04/2016   CD4TABS 750 06/06/2015   Lab Results  Component Value Date    HIV1RNAQUANT <20 NOT DETECTED 03/20/2017   Lab Results  Component Value Date   HEPBSAB NEG 03/08/2014   Lab Results  Component Value Date   LABRPR NON-REACTIVE 03/20/2017    CBC Lab Results  Component Value Date   WBC 5.5 03/20/2017   RBC 4.13 03/20/2017   HGB 11.8 03/20/2017   HCT 36.0 03/20/2017   PLT 336 03/20/2017   MCV 87.2 03/20/2017   MCH 28.6 03/20/2017   MCHC 32.8 03/20/2017   RDW 14.1 03/20/2017   LYMPHSABS 2,310 03/20/2017   MONOABS 260 01/04/2016   EOSABS 149 03/20/2017    BMET Lab Results  Component Value Date   NA 140 03/20/2017   K 4.7 03/20/2017   CL 106 03/20/2017   CO2 23 03/20/2017   GLUCOSE 119 (H) 03/20/2017   BUN 17 03/20/2017   CREATININE 1.05 (H) 03/20/2017   CALCIUM 9.9 03/20/2017   GFRNONAA 40 (L) 01/04/2016   GFRAA 46 (L) 01/04/2016      Assessment and Plan  hiv disease = appears still under good control based upon labs. Will plan to continue on triumeq. Alternatively, at next visit, can consider going to smaller pill like biktarvy. Will need to  find genotype  Health maintenance = will need flu shot if not already received it.  Low back pain = consider topical ointment like voltaren. She will call us back to see if that is the same ointment she uses at home.

## 2017-04-16 IMAGING — DX DG LUMBAR SPINE 2-3V
3 series · 3 of 3 positions shown · non-contrast
Comparison: Intraoperative fluoroscopy 01/31/2015. MRI lumbar spine
01/19/2015

CLINICAL DATA: Low back pain radiating to the right buttocks. L4-5
surgery on 01/31/2015

EXAM:
LUMBAR SPINE - 2-3 VIEW

[l-spine ap]
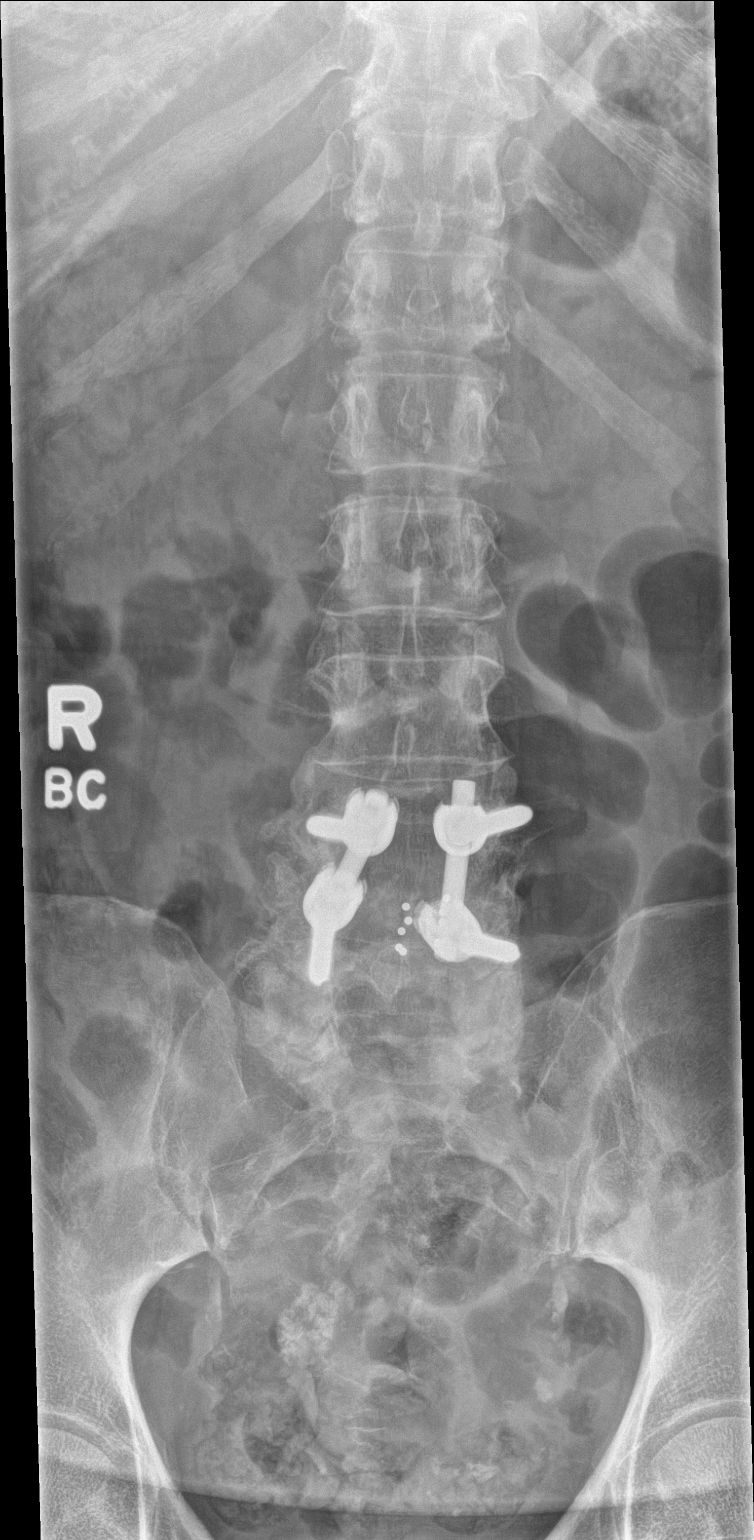

[l-spine lat]
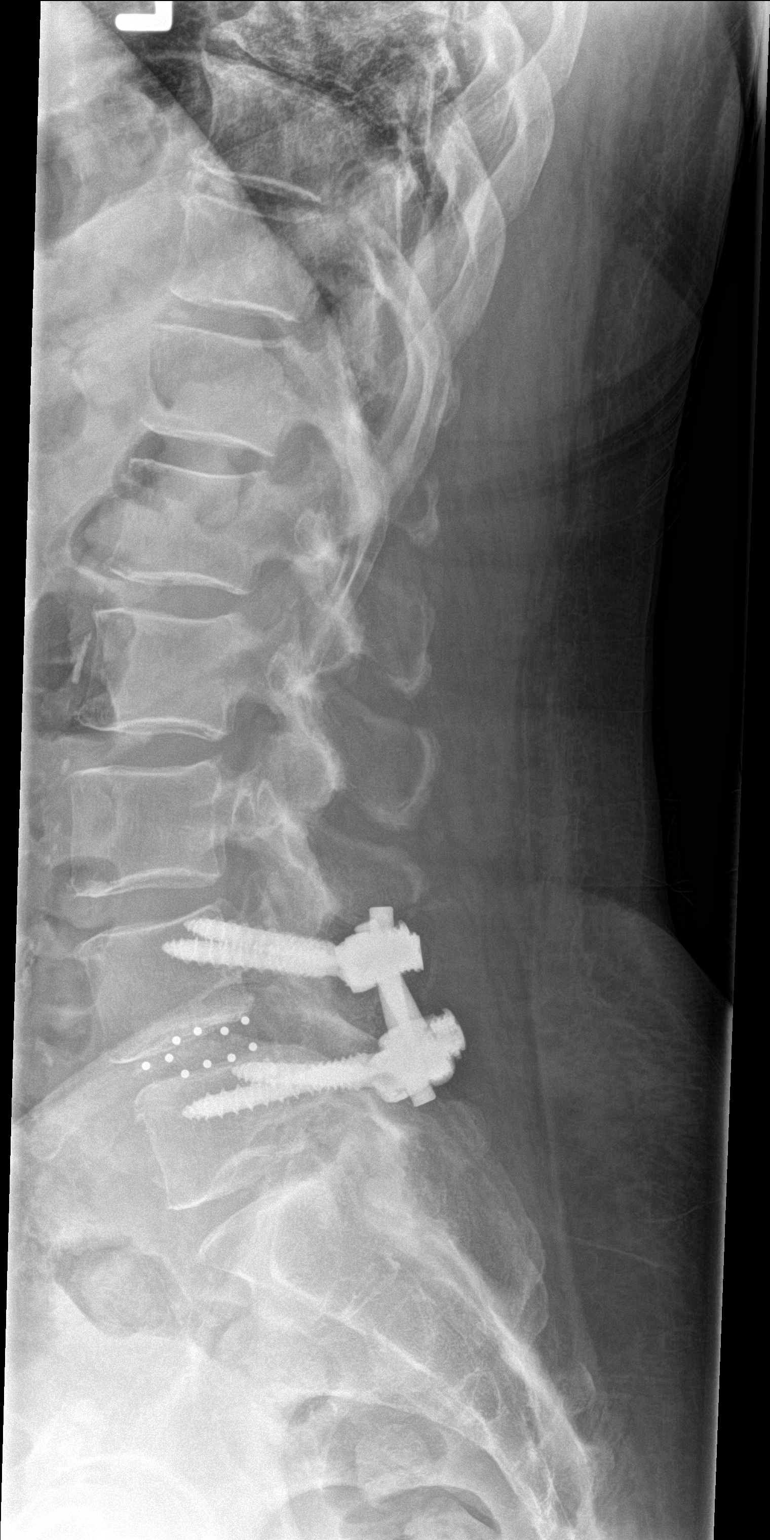

[l-spine spot]
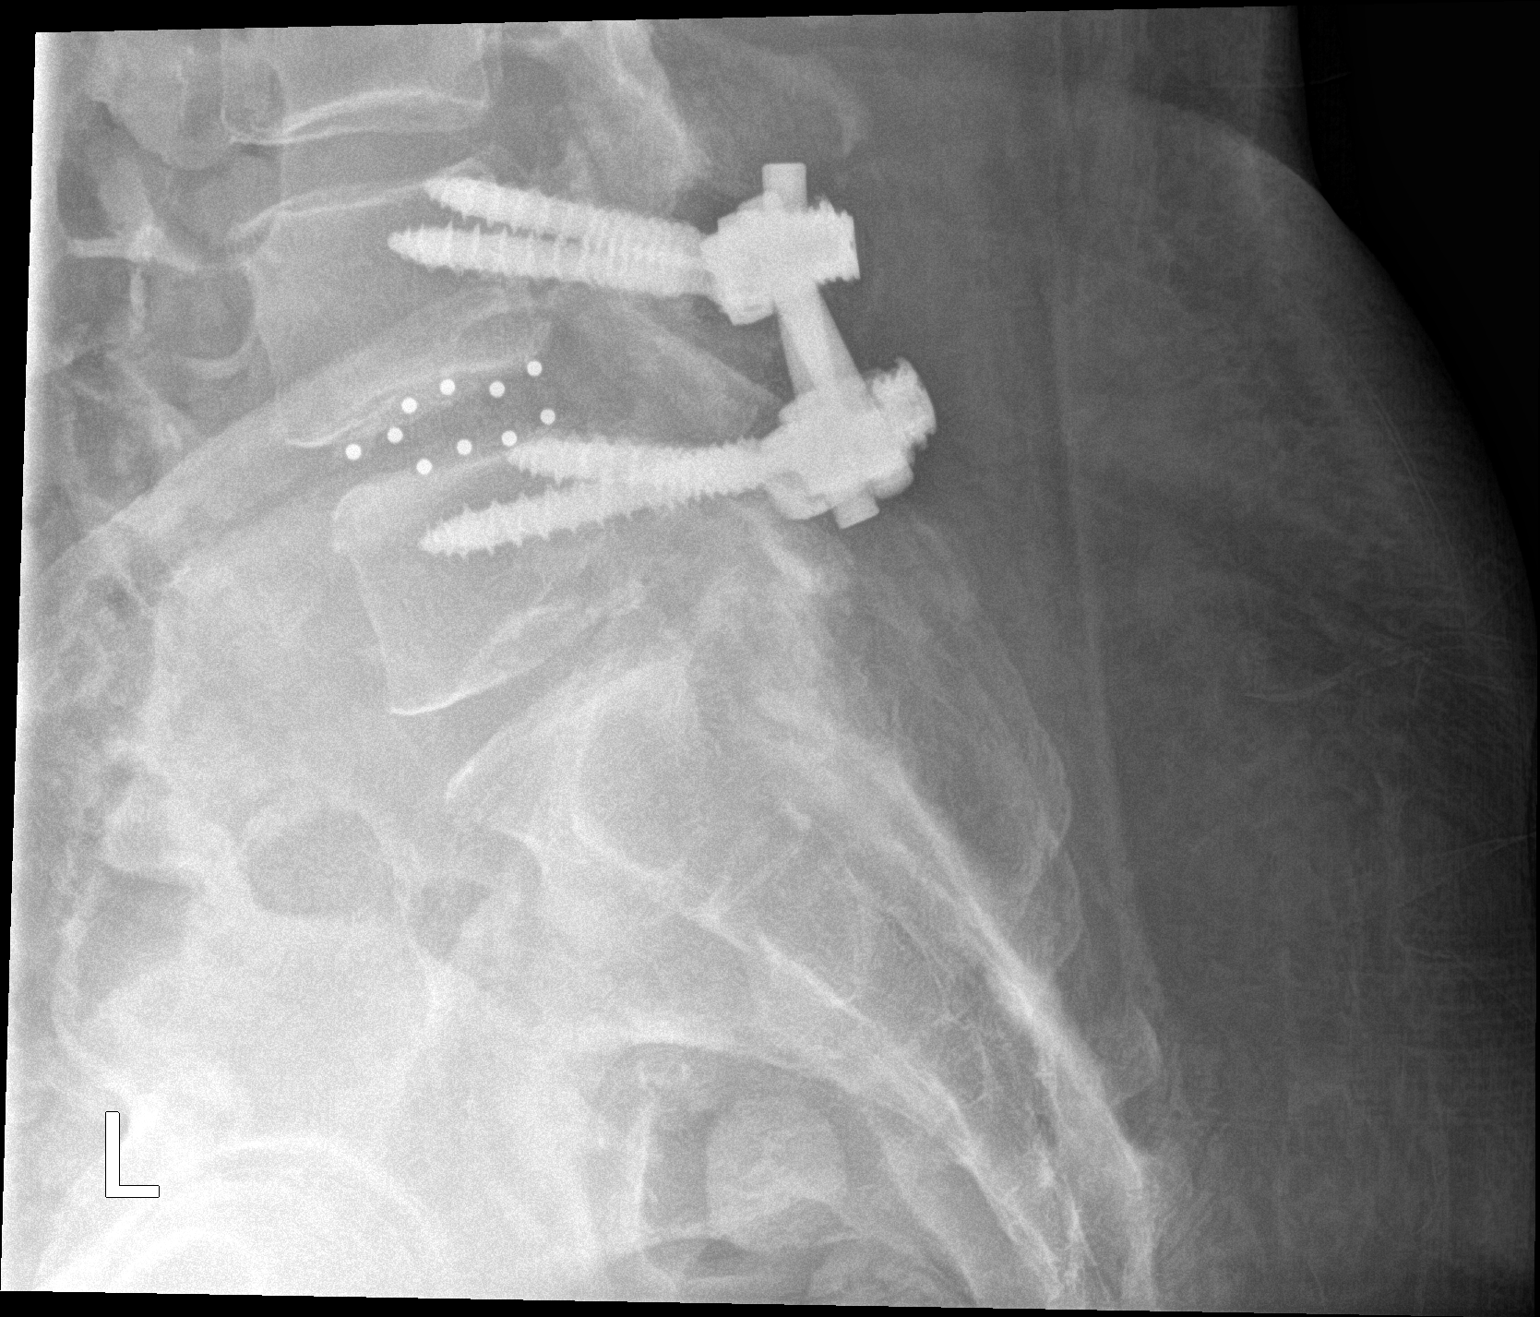

[3 of 3 positions shown; findings below may reference images not displayed]

FINDINGS: Postoperative changes with laminectomy, posterior rod and screw
fixation, and intervertebral disc prosthesis at L4-5. There is
minimal anterior subluxation of L4 on L5 which appears unchanged
since the previous study. Otherwise normal alignment. Degenerative
changes throughout the lumbar spine with mild endplate hypertrophic
changes present. No vertebral compression deformities.
Intervertebral disc space heights are preserved. No focal bone
lesion or bone destruction. Visualized sacrum appears intact.
Vascular calcifications.
IMPRESSION: Postoperative changes with posterior fixation at L4-5. Alignment and
appearance of surgical hardware appear unchanged.

## 2017-04-22 ENCOUNTER — Ambulatory Visit: Payer: Self-pay | Admitting: Infectious Diseases

## 2017-05-07 ENCOUNTER — Ambulatory Visit
Admission: RE | Admit: 2017-05-07 | Discharge: 2017-05-07 | Disposition: A | Discharge status: Home / Self Care | Payer: Medicare Other | Source: Ambulatory Visit | Attending: Internal Medicine | Admitting: Internal Medicine

## 2017-05-07 DIAGNOSIS — Z1231 Encounter for screening mammogram for malignant neoplasm of breast: Secondary | ICD-10-CM

## 2017-05-08 ENCOUNTER — Other Ambulatory Visit: Payer: Self-pay | Admitting: Internal Medicine

## 2017-05-08 DIAGNOSIS — R928 Other abnormal and inconclusive findings on diagnostic imaging of breast: Secondary | ICD-10-CM

## 2017-05-09 ENCOUNTER — Other Ambulatory Visit: Payer: Self-pay | Admitting: Internal Medicine

## 2017-05-09 DIAGNOSIS — I10 Essential (primary) hypertension: Secondary | ICD-10-CM

## 2017-05-20 ENCOUNTER — Ambulatory Visit
Admission: RE | Admit: 2017-05-20 | Discharge: 2017-05-20 | Disposition: A | Discharge status: Home / Self Care | Payer: Medicare Other | Source: Ambulatory Visit | Attending: Internal Medicine | Admitting: Internal Medicine

## 2017-05-20 ENCOUNTER — Other Ambulatory Visit: Payer: Self-pay | Admitting: Internal Medicine

## 2017-05-20 DIAGNOSIS — N644 Mastodynia: Secondary | ICD-10-CM

## 2017-05-20 DIAGNOSIS — R928 Other abnormal and inconclusive findings on diagnostic imaging of breast: Secondary | ICD-10-CM

## 2017-05-20 DIAGNOSIS — R921 Mammographic calcification found on diagnostic imaging of breast: Secondary | ICD-10-CM

## 2017-09-23 ENCOUNTER — Other Ambulatory Visit: Payer: Self-pay

## 2017-09-23 DIAGNOSIS — B20 Human immunodeficiency virus [HIV] disease: Secondary | ICD-10-CM

## 2017-10-07 ENCOUNTER — Ambulatory Visit: Payer: Self-pay | Admitting: Internal Medicine

## 2017-12-24 ENCOUNTER — Telehealth: Payer: Self-pay | Admitting: *Deleted

## 2017-12-24 NOTE — Telephone Encounter (Signed)
[place] Patient has moved there, they will send a signed release for records.  Her first appointment is 01/13/18. Andree CossHowell, Michelle M, RN
